# Patient Record
Sex: Female | Born: 1940 | Race: Black or African American | Hispanic: No | Smoking: Former smoker
Health system: Southern US, Community
[De-identification: ages and names within clinical notes are randomized; demographics above are authoritative.]

## PROBLEM LIST (undated history)

## (undated) DIAGNOSIS — R112 Nausea with vomiting, unspecified: Secondary | ICD-10-CM

## (undated) DIAGNOSIS — E785 Hyperlipidemia, unspecified: Secondary | ICD-10-CM

## (undated) DIAGNOSIS — I10 Essential (primary) hypertension: Secondary | ICD-10-CM

## (undated) DIAGNOSIS — Z9889 Other specified postprocedural states: Secondary | ICD-10-CM

## (undated) DIAGNOSIS — E039 Hypothyroidism, unspecified: Secondary | ICD-10-CM

## (undated) DIAGNOSIS — E119 Type 2 diabetes mellitus without complications: Secondary | ICD-10-CM

## (undated) DIAGNOSIS — M1990 Unspecified osteoarthritis, unspecified site: Secondary | ICD-10-CM

## (undated) DIAGNOSIS — M069 Rheumatoid arthritis, unspecified: Secondary | ICD-10-CM

## (undated) DIAGNOSIS — M2550 Pain in unspecified joint: Secondary | ICD-10-CM

## (undated) DIAGNOSIS — Z8709 Personal history of other diseases of the respiratory system: Secondary | ICD-10-CM

## (undated) HISTORY — PX: THYROIDECTOMY, PARTIAL: SHX18

## (undated) HISTORY — PX: APPENDECTOMY: SHX54

## (undated) HISTORY — PX: CATARACT EXTRACTION: SUR2

## (undated) HISTORY — PX: COLONOSCOPY: SHX174

---

## 1982-10-01 HISTORY — PX: ABDOMINAL HYSTERECTOMY: SHX81

## 2000-10-27 ENCOUNTER — Other Ambulatory Visit: Admission: RE | Admit: 2000-10-27 | Discharge: 2000-10-27 | Payer: Self-pay | Admitting: Internal Medicine

## 2001-07-09 ENCOUNTER — Encounter: Payer: Self-pay | Admitting: Emergency Medicine

## 2001-07-09 ENCOUNTER — Emergency Department (HOSPITAL_COMMUNITY): Admission: EM | Admit: 2001-07-09 | Discharge: 2001-07-09 | Payer: Self-pay | Admitting: Emergency Medicine

## 2001-10-16 ENCOUNTER — Other Ambulatory Visit: Admission: RE | Admit: 2001-10-16 | Discharge: 2001-10-16 | Payer: Self-pay | Admitting: Internal Medicine

## 2002-10-23 ENCOUNTER — Encounter: Admission: RE | Admit: 2002-10-23 | Discharge: 2002-10-23 | Payer: Self-pay | Admitting: Internal Medicine

## 2002-10-23 ENCOUNTER — Encounter: Payer: Self-pay | Admitting: Internal Medicine

## 2005-01-11 ENCOUNTER — Encounter: Admission: RE | Admit: 2005-01-11 | Discharge: 2005-01-11 | Payer: Self-pay | Admitting: Internal Medicine

## 2005-04-06 ENCOUNTER — Ambulatory Visit (HOSPITAL_COMMUNITY): Admission: RE | Admit: 2005-04-06 | Discharge: 2005-04-06 | Payer: Self-pay | Admitting: Gastroenterology

## 2005-04-06 ENCOUNTER — Encounter (INDEPENDENT_AMBULATORY_CARE_PROVIDER_SITE_OTHER): Payer: Self-pay | Admitting: *Deleted

## 2006-01-16 ENCOUNTER — Encounter: Admission: RE | Admit: 2006-01-16 | Discharge: 2006-01-16 | Payer: Self-pay | Admitting: Internal Medicine

## 2007-02-06 ENCOUNTER — Encounter: Admission: RE | Admit: 2007-02-06 | Discharge: 2007-02-06 | Payer: Self-pay | Admitting: Internal Medicine

## 2008-03-03 ENCOUNTER — Encounter: Admission: RE | Admit: 2008-03-03 | Discharge: 2008-03-03 | Payer: Self-pay | Admitting: Internal Medicine

## 2009-03-04 ENCOUNTER — Encounter: Admission: RE | Admit: 2009-03-04 | Discharge: 2009-03-04 | Payer: Self-pay | Admitting: Internal Medicine

## 2010-03-06 ENCOUNTER — Encounter: Admission: RE | Admit: 2010-03-06 | Discharge: 2010-03-06 | Payer: Self-pay | Admitting: Internal Medicine

## 2011-02-02 ENCOUNTER — Other Ambulatory Visit: Payer: Self-pay | Admitting: Internal Medicine

## 2011-02-02 DIAGNOSIS — Z1231 Encounter for screening mammogram for malignant neoplasm of breast: Secondary | ICD-10-CM

## 2011-02-16 NOTE — Op Note (Signed)
NAME:  Hannah Castillo, Hannah Castillo NO.:  [id number]   MEDICAL RECORD NO.:  [id number]          PATIENT TYPE:  AMB   LOCATION:  ENDO                         FACILITY:  [facility]   PHYSICIAN:  Anselmo Rod, M.D.  DATE OF BIRTH:  March 07, 1941   DATE OF PROCEDURE:  04/06/2005  DATE OF DISCHARGE:                                 OPERATIVE REPORT   PROCEDURE PERFORMED:  Colonoscopy with snare polypectomy times four and cold  biopsies times two.   ENDOSCOPIST:  Charna Elizabeth, M.D.   INSTRUMENT USED:  Olympus video colonoscope.   INDICATIONS FOR PROCEDURE:  The patient is a 70 year old African-American  female undergoing screening colonoscopy to rule out colonic polyps, masses,  etc.   PREPROCEDURE PREPARATION:  Informed consent was procured from the patient.  The patient was fasted for eight hours prior to the procedure and prepped  with a bottle of magnesium citrate and a gallon of GoLYTELY the night prior  to the procedure.  The risks and benefits of the procedure including a 10%  miss rate for colon polyps or cancers was discussed with the patient as  well.   PREPROCEDURE PHYSICAL:  The patient had stable vital signs.  Neck supple.  Chest clear to auscultation.  S1 and S2 regular.  Abdomen soft with normal  bowel sounds.   DESCRIPTION OF PROCEDURE:  The patient was placed in left lateral decubitus  position and sedated with 100 mg of Demerol and 10 mg of Versed in slow  incremental doses.  Once the patient was adequately sedated and maintained  on low flow oxygen and continuous cardiac monitoring, the Olympus video  colonoscope was advanced from the rectum to the cecum.  The appendicular  orifice and ileocecal valve were clearly visualized and photographed.  The  patient had pandiverticular disease with more prominent changes in the  sigmoid colon.  Two small sessile polyps were biopsied from the rectosigmoid  colon (cold biopsies) and two small sessile polyps were snared from  the  rectosigmoid colon.  Two additional small sessile polyps were snared from  the rectum.  All of these polyps were removed by cold snare.  Small internal  hemorrhoids were seen on retroflexion.  The rest of the exam was  unremarkable.  The appendicular orifice and ileocecal valve were visualized  and photographed.   IMPRESSION:  1.  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  2.  Small nonbleeding internal hemorrhoids.  3.  Four small sessile polyps snared (cold snare) from the rectosigmoid      colon and rectum and two small sessile polyps cold biopsied from the      rectosigmoid colon.   RECOMMENDATIONS:  1.  Await pathology results.  2.  Avoid all nonsteroidals including aspirin for now.  3.  Repeat colonoscopy depending on pathology results.  4.  Brochures on diverticulosis have been given to the patient for      education.  5.  Outpatient followup as need arises in the future.       JNM/MEDQ  D:  04/06/2005  T:  04/06/2005  Job:  [id number]  cc:   Theda Belfast. Baird Cancer, M.D.  [address]  [address] [place] [address]  Fax: [phone]

## 2011-03-13 ENCOUNTER — Ambulatory Visit
Admission: RE | Admit: 2011-03-13 | Discharge: 2011-03-13 | Disposition: A | Discharge status: Home / Self Care | Payer: Medicare Other | Source: Ambulatory Visit | Attending: Internal Medicine | Admitting: Internal Medicine

## 2011-03-13 DIAGNOSIS — Z1231 Encounter for screening mammogram for malignant neoplasm of breast: Secondary | ICD-10-CM

## 2011-03-14 ENCOUNTER — Other Ambulatory Visit: Payer: Self-pay | Admitting: Internal Medicine

## 2011-03-14 DIAGNOSIS — R928 Other abnormal and inconclusive findings on diagnostic imaging of breast: Secondary | ICD-10-CM

## 2011-03-21 ENCOUNTER — Ambulatory Visit
Admission: RE | Admit: 2011-03-21 | Discharge: 2011-03-21 | Disposition: A | Discharge status: Home / Self Care | Payer: Medicare Other | Source: Ambulatory Visit | Attending: Internal Medicine | Admitting: Internal Medicine

## 2011-03-21 DIAGNOSIS — R928 Other abnormal and inconclusive findings on diagnostic imaging of breast: Secondary | ICD-10-CM

## 2011-09-05 ENCOUNTER — Other Ambulatory Visit: Payer: Self-pay | Admitting: Internal Medicine

## 2011-09-05 DIAGNOSIS — N63 Unspecified lump in unspecified breast: Secondary | ICD-10-CM

## 2011-09-19 ENCOUNTER — Ambulatory Visit
Admission: RE | Admit: 2011-09-19 | Discharge: 2011-09-19 | Disposition: A | Discharge status: Home / Self Care | Payer: Medicare Other | Source: Ambulatory Visit | Attending: Internal Medicine | Admitting: Internal Medicine

## 2011-09-19 DIAGNOSIS — N63 Unspecified lump in unspecified breast: Secondary | ICD-10-CM

## 2011-10-04 ENCOUNTER — Encounter (HOSPITAL_COMMUNITY): Payer: Self-pay

## 2011-10-04 DIAGNOSIS — E7800 Pure hypercholesterolemia, unspecified: Secondary | ICD-10-CM | POA: Diagnosis not present

## 2011-10-04 DIAGNOSIS — M0840 Pauciarticular juvenile rheumatoid arthritis, unspecified site: Secondary | ICD-10-CM | POA: Diagnosis not present

## 2011-10-04 DIAGNOSIS — Z01810 Encounter for preprocedural cardiovascular examination: Secondary | ICD-10-CM | POA: Diagnosis not present

## 2011-10-04 DIAGNOSIS — I10 Essential (primary) hypertension: Secondary | ICD-10-CM | POA: Diagnosis not present

## 2011-10-05 DIAGNOSIS — E7800 Pure hypercholesterolemia, unspecified: Secondary | ICD-10-CM | POA: Diagnosis not present

## 2011-10-05 DIAGNOSIS — I10 Essential (primary) hypertension: Secondary | ICD-10-CM | POA: Diagnosis not present

## 2011-10-05 DIAGNOSIS — M0840 Pauciarticular juvenile rheumatoid arthritis, unspecified site: Secondary | ICD-10-CM | POA: Diagnosis not present

## 2011-10-05 DIAGNOSIS — Z01810 Encounter for preprocedural cardiovascular examination: Secondary | ICD-10-CM | POA: Diagnosis not present

## 2011-10-09 DIAGNOSIS — M1710 Unilateral primary osteoarthritis, unspecified knee: Secondary | ICD-10-CM | POA: Diagnosis not present

## 2011-10-12 ENCOUNTER — Encounter (HOSPITAL_COMMUNITY): Payer: Self-pay

## 2011-10-12 ENCOUNTER — Encounter (HOSPITAL_COMMUNITY)
Admission: RE | Admit: 2011-10-12 | Discharge: 2011-10-12 | Disposition: A | Discharge status: Home / Self Care | Payer: Medicare Other | Source: Ambulatory Visit | Attending: Surgery | Admitting: Surgery

## 2011-10-12 ENCOUNTER — Encounter (HOSPITAL_COMMUNITY)
Admission: RE | Admit: 2011-10-12 | Discharge: 2011-10-12 | Disposition: A | Discharge status: Home / Self Care | Payer: Medicare Other | Source: Ambulatory Visit | Attending: Orthopedic Surgery | Admitting: Orthopedic Surgery

## 2011-10-12 DIAGNOSIS — Z01811 Encounter for preprocedural respiratory examination: Secondary | ICD-10-CM | POA: Diagnosis not present

## 2011-10-12 HISTORY — DX: Essential (primary) hypertension: I10

## 2011-10-12 HISTORY — DX: Hypothyroidism, unspecified: E03.9

## 2011-10-12 HISTORY — DX: Other specified postprocedural states: R11.2

## 2011-10-12 HISTORY — DX: Rheumatoid arthritis, unspecified: M06.9

## 2011-10-12 HISTORY — DX: Unspecified osteoarthritis, unspecified site: M19.90

## 2011-10-12 HISTORY — DX: Other specified postprocedural states: Z98.890

## 2011-10-12 LAB — PROTIME-INR
INR: 1 (ref 0.00–1.49)
Prothrombin Time: 13.4 s (ref 11.6–15.2)

## 2011-10-12 LAB — URINALYSIS, ROUTINE W REFLEX MICROSCOPIC
Bilirubin Urine: NEGATIVE
Glucose, UA: NEGATIVE mg/dL
Hgb urine dipstick: NEGATIVE
Specific Gravity, Urine: 1.019 (ref 1.005–1.030)
Urobilinogen, UA: 0.2 mg/dL (ref 0.0–1.0)

## 2011-10-12 LAB — CBC
HCT: 40.9 % (ref 36.0–46.0)
Hemoglobin: 13.6 g/dL (ref 12.0–15.0)
MCH: 30 pg (ref 26.0–34.0)
MCHC: 33.3 g/dL (ref 30.0–36.0)
RBC: 4.54 MIL/uL (ref 3.87–5.11)

## 2011-10-12 LAB — COMPREHENSIVE METABOLIC PANEL WITH GFR
ALT: 38 U/L — ABNORMAL HIGH (ref 0–35)
AST: 27 U/L (ref 0–37)
Albumin: 3.8 g/dL (ref 3.5–5.2)
Alkaline Phosphatase: 173 U/L — ABNORMAL HIGH (ref 39–117)
BUN: 16 mg/dL (ref 6–23)
CO2: 30 meq/L (ref 19–32)
Calcium: 10.8 mg/dL — ABNORMAL HIGH (ref 8.4–10.5)
Chloride: 104 meq/L (ref 96–112)
Creatinine, Ser: 0.87 mg/dL (ref 0.50–1.10)
GFR calc Af Amer: 75 mL/min — ABNORMAL LOW (ref >90)
GFR calc non Af Amer: 65 mL/min — ABNORMAL LOW (ref >90)
Glucose, Bld: 82 mg/dL (ref 70–99)
Potassium: 4.3 meq/L (ref 3.5–5.1)
Sodium: 141 meq/L (ref 135–145)
Total Bilirubin: 0.8 mg/dL (ref 0.3–1.2)
Total Protein: 7 g/dL (ref 6.0–8.3)

## 2011-10-12 LAB — ABO/RH: ABO/RH(D): O POS

## 2011-10-12 LAB — SURGICAL PCR SCREEN
MRSA, PCR: NEGATIVE
Staphylococcus aureus: NEGATIVE

## 2011-10-12 LAB — TYPE AND SCREEN: Antibody Screen: NEGATIVE

## 2011-10-12 NOTE — Pre-Procedure Instructions (Signed)
20 XARA PAULDING  10/12/2011   Your procedure is scheduled on:  October 17, 2011  Report to [facility] at 0630 AM.  Call this number if you have problems the morning of surgery: [phone]   Remember:   Do not eat food:After Midnight.  May have clear liquids: up to 4 Hours before arrival.  Clear liquids include soda, tea, black coffee, apple or grape juice, broth.  Take these medicines the morning of surgery with A SIP OF WATER: Synthroid   Stop Aspirin today  Do not wear jewelry, make-up or nail polish.  Do not wear lotions, powders, or perfumes. You may wear deodorant.  Do not shave 48 hours prior to surgery.  Do not bring valuables to the hospital.  Contacts, dentures or bridgework may not be worn into surgery.  Leave suitcase in the car. After surgery it may be brought to your room.  For patients admitted to the hospital, checkout time is 11:00 AM the day of discharge.     Special Instructions: Incentive Spirometry - Practice and bring it with you on the day of surgery. and CHG Shower Use Special Wash: 1/2 bottle night before surgery and 1/2 bottle morning of surgery.   Please read over the following fact sheets that you were given: Pain Booklet, Coughing and Deep Breathing, Blood Transfusion Information, Total Joint Packet, MRSA Information and Surgical Site Infection Prevention

## 2011-10-15 NOTE — H&P (Signed)
  [facility] [address]   [address] [place], [place] [address] [phone] A Division of [facility]  Loreta Ave, M.D.     Robert A. Thurston Hole, M.D.     Lunette Stands, M.D. Eulas Post, M.D.    Buford Dresser, M.D. Estell Harpin, M.D. Ralene Cork, D.O.          Genene Churn. Barry Dienes, PA-C            Kirstin A. Shepperson, PA-C [place], OPA-C   RE: Hannah, Castillo                                [id number]      DOB: 09-27-1941 PROGRESS NOTE: 10-09-11 Chief complaint: Right knee pain.  History of present illness: 32 two year-old female with a history of end stage DJD, right knee, and chronic pain.  Returns.  States that knee symptoms are unchanged from previous visit.  She is wanting to proceed with total knee replacement as scheduled.  We are awaiting pre-op clearance from Dr. Nadara Eaton.  She recently had a stress test.  Denies chest pain or shortness of breath.   Current medications: Aspirin, Synthroid, HCTZ, Lipitor and Plaquenil. Allergies: Hydrocodone/Ibuprofen (vomiting).  Past medical/surgical history: Hypertension, hypercholesterolemia, rheumatoid arthritis, hypothyroidism, thyroidectomy, appendectomy and hysterectomy. Review of systems: Patient denies fevers, chills, lightheadedness, dizziness, GI or GU issues.    Family history: Positive for heart disease, hypertension and stroke. Social history: Quit smoking in 2003 and has a 40 year history previously.  Denies alcohol use.  Patient lives alone, has a daughter here in town.       EXAMINATION: Temperature: 97.8.  Pulse: 77.  Blood pressure: 129/80.  Height: 5?5.  Weight: 185 pounds.  Pleasant female, alert and oriented x 3 and in no acute distress.  Head is normocephalic, a traumatic.  PERRLA, EOMI.  Neck unremarkable.  Lungs: CTA bilaterally.  No wheezes.  Heart: RRR.  S1 and S2.  Abdomen: Round and non-distended.  NBS x 4.  Soft and non-tender.   Right knee: Decreased range of motion.  Positive crepitus.  Joint line tender.  1-2+ effusion.  Ligaments stable.  Calf non-tender.  Neurovascularly intact.  Skin warm and dry.    IMPRESSION: End stage DJD, right knee, and chronic pain.  Failed conservative treatment.   PLAN:  We will proceed with right total knee replacement as scheduled.  We will need to get final clearance from Dr. Nadara Eaton.  We will hold Plaquenil x one week post-op, per Dr. Fatima Sanger recommendation.  All questions answered.    Loreta Ave, M.D.   Electronically verified by Loreta Ave, M.D. DFM(JMO):jjh D 10-09-11 T 10-10-11

## 2011-10-16 MED ORDER — CEFAZOLIN SODIUM-DEXTROSE 2-3 GM-% IV SOLR
2.0000 g | INTRAVENOUS | Status: AC
  Administered 2011-10-17: 2 g via INTRAVENOUS
  Filled 2011-10-16 (×2): qty 50

## 2011-10-17 ENCOUNTER — Encounter (HOSPITAL_COMMUNITY)
Admission: RE | Disposition: A | Discharge status: Skilled Nursing Facility | Payer: Self-pay | Source: Ambulatory Visit | Attending: Orthopedic Surgery

## 2011-10-17 ENCOUNTER — Encounter (HOSPITAL_COMMUNITY): Payer: Self-pay | Admitting: *Deleted

## 2011-10-17 ENCOUNTER — Ambulatory Visit (HOSPITAL_COMMUNITY): Payer: Medicare Other

## 2011-10-17 ENCOUNTER — Encounter (HOSPITAL_COMMUNITY): Payer: Self-pay | Admitting: Anesthesiology

## 2011-10-17 ENCOUNTER — Ambulatory Visit (HOSPITAL_COMMUNITY): Payer: Medicare Other | Admitting: Anesthesiology

## 2011-10-17 ENCOUNTER — Inpatient Hospital Stay (HOSPITAL_COMMUNITY)
Admission: RE | Admit: 2011-10-17 | Discharge: 2011-10-21 | DRG: 470 | Disposition: A | Discharge status: Skilled Nursing Facility | Payer: Medicare Other | Source: Ambulatory Visit | Attending: Orthopedic Surgery | Admitting: Orthopedic Surgery

## 2011-10-17 DIAGNOSIS — M1710 Unilateral primary osteoarthritis, unspecified knee: Principal | ICD-10-CM | POA: Diagnosis present

## 2011-10-17 DIAGNOSIS — Z01812 Encounter for preprocedural laboratory examination: Secondary | ICD-10-CM

## 2011-10-17 DIAGNOSIS — E039 Hypothyroidism, unspecified: Secondary | ICD-10-CM | POA: Diagnosis present

## 2011-10-17 DIAGNOSIS — K5900 Constipation, unspecified: Secondary | ICD-10-CM | POA: Diagnosis not present

## 2011-10-17 DIAGNOSIS — G8918 Other acute postprocedural pain: Secondary | ICD-10-CM | POA: Diagnosis not present

## 2011-10-17 DIAGNOSIS — M069 Rheumatoid arthritis, unspecified: Secondary | ICD-10-CM | POA: Diagnosis present

## 2011-10-17 DIAGNOSIS — Z87891 Personal history of nicotine dependence: Secondary | ICD-10-CM

## 2011-10-17 DIAGNOSIS — Z96659 Presence of unspecified artificial knee joint: Secondary | ICD-10-CM | POA: Diagnosis not present

## 2011-10-17 DIAGNOSIS — I10 Essential (primary) hypertension: Secondary | ICD-10-CM | POA: Diagnosis not present

## 2011-10-17 DIAGNOSIS — E7800 Pure hypercholesterolemia, unspecified: Secondary | ICD-10-CM | POA: Diagnosis present

## 2011-10-17 DIAGNOSIS — Z471 Aftercare following joint replacement surgery: Secondary | ICD-10-CM | POA: Diagnosis not present

## 2011-10-17 DIAGNOSIS — E785 Hyperlipidemia, unspecified: Secondary | ICD-10-CM | POA: Diagnosis not present

## 2011-10-17 DIAGNOSIS — M25569 Pain in unspecified knee: Secondary | ICD-10-CM | POA: Diagnosis not present

## 2011-10-17 DIAGNOSIS — S99929A Unspecified injury of unspecified foot, initial encounter: Secondary | ICD-10-CM | POA: Diagnosis not present

## 2011-10-17 DIAGNOSIS — Z5189 Encounter for other specified aftercare: Secondary | ICD-10-CM | POA: Diagnosis not present

## 2011-10-17 HISTORY — PX: TOTAL KNEE ARTHROPLASTY: SHX125

## 2011-10-17 SURGERY — ARTHROPLASTY, KNEE, TOTAL
Anesthesia: General | Site: Knee | Laterality: Right

## 2011-10-17 MED ORDER — FENTANYL CITRATE 0.05 MG/ML IJ SOLN
INTRAMUSCULAR | Status: DC | PRN
  Administered 2011-10-17 (×3): 50 ug via INTRAVENOUS
  Administered 2011-10-17: 100 ug via INTRAVENOUS

## 2011-10-17 MED ORDER — WARFARIN SODIUM 7.5 MG PO TABS
7.5000 mg | ORAL_TABLET | Freq: Once | ORAL | Status: AC
  Administered 2011-10-17: 7.5 mg via ORAL
  Filled 2011-10-17: qty 1

## 2011-10-17 MED ORDER — MENTHOL 3 MG MT LOZG
1.0000 | LOZENGE | OROMUCOSAL | Status: DC | PRN
  Filled 2011-10-17: qty 9

## 2011-10-17 MED ORDER — LIDOCAINE HCL (CARDIAC) 20 MG/ML IV SOLN
INTRAVENOUS | Status: DC | PRN
  Administered 2011-10-17: 40 mg via INTRAVENOUS

## 2011-10-17 MED ORDER — BUPIVACAINE HCL (PF) 0.5 % IJ SOLN
INTRAMUSCULAR | Status: DC | PRN
  Administered 2011-10-17: 20 mL

## 2011-10-17 MED ORDER — ACETAMINOPHEN 10 MG/ML IV SOLN
INTRAVENOUS | Status: AC
  Filled 2011-10-17: qty 100

## 2011-10-17 MED ORDER — NEOSTIGMINE METHYLSULFATE 1 MG/ML IJ SOLN
INTRAMUSCULAR | Status: DC | PRN
  Administered 2011-10-17: 4 mg via INTRAVENOUS

## 2011-10-17 MED ORDER — ACETAMINOPHEN 650 MG RE SUPP: 650.0000 mg | Freq: Four times a day (QID) | RECTAL | Status: DC | PRN

## 2011-10-17 MED ORDER — HETASTARCH-ELECTROLYTES 6 % IV SOLN
INTRAVENOUS | Status: DC | PRN
  Administered 2011-10-17: 09:00:00 via INTRAVENOUS

## 2011-10-17 MED ORDER — COUMADIN BOOK
Freq: Once | Status: AC
  Administered 2011-10-17: 18:00:00
  Filled 2011-10-17: qty 1

## 2011-10-17 MED ORDER — ROSUVASTATIN CALCIUM 20 MG PO TABS
20.0000 mg | ORAL_TABLET | Freq: Every day | ORAL | Status: DC
  Administered 2011-10-17 – 2011-10-21 (×5): 20 mg via ORAL
  Filled 2011-10-17 (×5): qty 1

## 2011-10-17 MED ORDER — GLYCOPYRROLATE 0.2 MG/ML IJ SOLN
INTRAMUSCULAR | Status: DC | PRN
  Administered 2011-10-17: .6 mg via INTRAVENOUS

## 2011-10-17 MED ORDER — BUPIVACAINE HCL (PF) 0.25 % IJ SOLN
INTRAMUSCULAR | Status: DC | PRN
  Administered 2011-10-17: 30 mL via INTRA_ARTICULAR

## 2011-10-17 MED ORDER — ACETAMINOPHEN 325 MG PO TABS
650.0000 mg | ORAL_TABLET | Freq: Four times a day (QID) | ORAL | Status: DC | PRN
  Administered 2011-10-18: 650 mg via ORAL
  Filled 2011-10-17: qty 2

## 2011-10-17 MED ORDER — LACTATED RINGERS IV SOLN
INTRAVENOUS | Status: DC | PRN
  Administered 2011-10-17: 08:00:00 via INTRAVENOUS

## 2011-10-17 MED ORDER — METHOCARBAMOL 500 MG PO TABS
500.0000 mg | ORAL_TABLET | Freq: Four times a day (QID) | ORAL | Status: DC | PRN
  Administered 2011-10-18 – 2011-10-19 (×4): 500 mg via ORAL
  Filled 2011-10-17 (×5): qty 1

## 2011-10-17 MED ORDER — ROCURONIUM BROMIDE 100 MG/10ML IV SOLN
INTRAVENOUS | Status: DC | PRN
  Administered 2011-10-17: 35 mg via INTRAVENOUS

## 2011-10-17 MED ORDER — ONDANSETRON HCL 4 MG/2ML IJ SOLN: 4.0000 mg | Freq: Once | INTRAMUSCULAR | Status: DC | PRN

## 2011-10-17 MED ORDER — PROPOFOL 10 MG/ML IV EMUL
INTRAVENOUS | Status: DC | PRN
  Administered 2011-10-17: 170 mg via INTRAVENOUS

## 2011-10-17 MED ORDER — DOCUSATE SODIUM 100 MG PO CAPS
100.0000 mg | ORAL_CAPSULE | Freq: Two times a day (BID) | ORAL | Status: DC
  Administered 2011-10-17 – 2011-10-21 (×9): 100 mg via ORAL
  Filled 2011-10-17 (×10): qty 1

## 2011-10-17 MED ORDER — MORPHINE SULFATE (PF) 1 MG/ML IV SOLN
INTRAVENOUS | Status: AC
  Administered 2011-10-17: 25 mL via INTRAVENOUS
  Filled 2011-10-17: qty 25

## 2011-10-17 MED ORDER — PHENOL 1.4 % MT LIQD
1.0000 | OROMUCOSAL | Status: DC | PRN
  Filled 2011-10-17: qty 177

## 2011-10-17 MED ORDER — DIPHENHYDRAMINE HCL 12.5 MG/5ML PO ELIX
12.5000 mg | ORAL_SOLUTION | Freq: Four times a day (QID) | ORAL | Status: DC | PRN
  Filled 2011-10-17: qty 5

## 2011-10-17 MED ORDER — ENOXAPARIN SODIUM 30 MG/0.3ML ~~LOC~~ SOLN
30.0000 mg | Freq: Two times a day (BID) | SUBCUTANEOUS | Status: DC
  Administered 2011-10-17 – 2011-10-19 (×4): 30 mg via SUBCUTANEOUS
  Filled 2011-10-17 (×6): qty 0.3

## 2011-10-17 MED ORDER — HYDROMORPHONE HCL PF 1 MG/ML IJ SOLN
0.2500 mg | INTRAMUSCULAR | Status: DC | PRN
  Administered 2011-10-17: 0.5 mg via INTRAVENOUS

## 2011-10-17 MED ORDER — DEXAMETHASONE SODIUM PHOSPHATE 4 MG/ML IJ SOLN
INTRAMUSCULAR | Status: DC | PRN
  Administered 2011-10-17: 4 mg via INTRAVENOUS

## 2011-10-17 MED ORDER — DIPHENHYDRAMINE HCL 50 MG/ML IJ SOLN
12.5000 mg | Freq: Four times a day (QID) | INTRAMUSCULAR | Status: DC | PRN
  Filled 2011-10-17: qty 0.25

## 2011-10-17 MED ORDER — SENNOSIDES-DOCUSATE SODIUM 8.6-50 MG PO TABS: 1.0000 | ORAL_TABLET | Freq: Every evening | ORAL | Status: DC | PRN

## 2011-10-17 MED ORDER — LEVOTHYROXINE SODIUM 150 MCG PO TABS
150.0000 ug | ORAL_TABLET | Freq: Every day | ORAL | Status: DC
  Administered 2011-10-18 – 2011-10-21 (×4): 150 ug via ORAL
  Filled 2011-10-17 (×5): qty 1

## 2011-10-17 MED ORDER — HYDROCHLOROTHIAZIDE 12.5 MG PO CAPS
12.5000 mg | ORAL_CAPSULE | Freq: Every day | ORAL | Status: DC
  Filled 2011-10-17 (×5): qty 1

## 2011-10-17 MED ORDER — CEFAZOLIN SODIUM 1-5 GM-% IV SOLN
1.0000 g | Freq: Three times a day (TID) | INTRAVENOUS | Status: AC
  Administered 2011-10-17 – 2011-10-18 (×3): 1 g via INTRAVENOUS
  Filled 2011-10-17 (×3): qty 50

## 2011-10-17 MED ORDER — ONDANSETRON HCL 4 MG/2ML IJ SOLN
INTRAMUSCULAR | Status: DC | PRN
  Administered 2011-10-17: 4 mg via INTRAVENOUS

## 2011-10-17 MED ORDER — SODIUM CHLORIDE 0.9 % IJ SOLN: 9.0000 mL | INTRAMUSCULAR | Status: DC | PRN

## 2011-10-17 MED ORDER — FLEET ENEMA 7-19 GM/118ML RE ENEM: 1.0000 | ENEMA | Freq: Once | RECTAL | Status: AC | PRN

## 2011-10-17 MED ORDER — ONDANSETRON HCL 4 MG PO TABS: 4.0000 mg | ORAL_TABLET | Freq: Four times a day (QID) | ORAL | Status: DC | PRN

## 2011-10-17 MED ORDER — OXYCODONE-ACETAMINOPHEN 5-325 MG PO TABS
1.0000 | ORAL_TABLET | ORAL | Status: DC | PRN
  Administered 2011-10-18 – 2011-10-21 (×9): 2 via ORAL
  Filled 2011-10-17 (×10): qty 2

## 2011-10-17 MED ORDER — LISINOPRIL-HYDROCHLOROTHIAZIDE 10-12.5 MG PO TABS: 1.0000 | ORAL_TABLET | Freq: Every day | ORAL | Status: DC

## 2011-10-17 MED ORDER — INFLUENZA VIRUS VACC SPLIT PF IM SUSP
0.5000 mL | INTRAMUSCULAR | Status: AC
  Filled 2011-10-17: qty 0.5

## 2011-10-17 MED ORDER — ONDANSETRON HCL 4 MG/2ML IJ SOLN
4.0000 mg | Freq: Four times a day (QID) | INTRAMUSCULAR | Status: DC | PRN
  Administered 2011-10-17: 4 mg via INTRAVENOUS
  Filled 2011-10-17: qty 2

## 2011-10-17 MED ORDER — ONDANSETRON HCL 4 MG/2ML IJ SOLN
4.0000 mg | Freq: Four times a day (QID) | INTRAMUSCULAR | Status: DC | PRN
  Filled 2011-10-17: qty 2

## 2011-10-17 MED ORDER — SODIUM CHLORIDE 0.9 % IR SOLN
Status: DC | PRN
  Administered 2011-10-17: 1000 mL
  Administered 2011-10-17: 3000 mL

## 2011-10-17 MED ORDER — PHENYLEPHRINE HCL 10 MG/ML IJ SOLN
INTRAMUSCULAR | Status: DC | PRN
  Administered 2011-10-17 (×3): 80 ug via INTRAVENOUS
  Administered 2011-10-17: 40 ug via INTRAVENOUS
  Administered 2011-10-17: 80 ug via INTRAVENOUS

## 2011-10-17 MED ORDER — POTASSIUM CHLORIDE IN NACL 20-0.9 MEQ/L-% IV SOLN
INTRAVENOUS | Status: DC
  Administered 2011-10-17: 14:00:00 via INTRAVENOUS
  Filled 2011-10-17 (×11): qty 1000

## 2011-10-17 MED ORDER — LISINOPRIL 10 MG PO TABS
10.0000 mg | ORAL_TABLET | Freq: Every day | ORAL | Status: DC
  Filled 2011-10-17 (×5): qty 1

## 2011-10-17 MED ORDER — METHOCARBAMOL 100 MG/ML IJ SOLN
500.0000 mg | Freq: Four times a day (QID) | INTRAVENOUS | Status: DC | PRN
  Filled 2011-10-17: qty 5

## 2011-10-17 MED ORDER — METHOCARBAMOL 100 MG/ML IJ SOLN
500.0000 mg | INTRAMUSCULAR | Status: AC
  Administered 2011-10-17: 500 mg via INTRAVENOUS
  Filled 2011-10-17: qty 5

## 2011-10-17 MED ORDER — MORPHINE SULFATE 4 MG/ML IJ SOLN
INTRAMUSCULAR | Status: DC | PRN
  Administered 2011-10-17: 4 mg via SUBCUTANEOUS

## 2011-10-17 MED ORDER — ACETAMINOPHEN 10 MG/ML IV SOLN
INTRAVENOUS | Status: DC | PRN
  Administered 2011-10-17: 1000 mg via INTRAVENOUS

## 2011-10-17 MED ORDER — NALOXONE HCL 0.4 MG/ML IJ SOLN
0.4000 mg | INTRAMUSCULAR | Status: DC | PRN
  Filled 2011-10-17: qty 1

## 2011-10-17 MED ORDER — WARFARIN VIDEO: Freq: Once | Status: DC

## 2011-10-17 MED ORDER — MORPHINE SULFATE (PF) 1 MG/ML IV SOLN
INTRAVENOUS | Status: DC
  Administered 2011-10-17 (×2): 1 mg via INTRAVENOUS
  Administered 2011-10-18: 7 mg via INTRAVENOUS
  Administered 2011-10-18: 1 mg via INTRAVENOUS
  Administered 2011-10-18: 6 mg via INTRAVENOUS
  Filled 2011-10-17: qty 25

## 2011-10-17 SURGICAL SUPPLY — 64 items
BANDAGE ESMARK 6X9 LF (GAUZE/BANDAGES/DRESSINGS) ×1 IMPLANT
BLADE SAG 18X100X1.27 (BLADE) ×4 IMPLANT
BNDG CMPR 9X6 STRL LF SNTH (GAUZE/BANDAGES/DRESSINGS) ×1
BNDG ESMARK 6X9 LF (GAUZE/BANDAGES/DRESSINGS) ×2
BOOTCOVER CLEANROOM LRG (PROTECTIVE WEAR) ×4 IMPLANT
BOWL SMART MIX CTS (DISPOSABLE) ×2 IMPLANT
CEMENT BONE SIMPLEX SPEEDSET (Cement) ×4 IMPLANT
CLOTH BEACON ORANGE TIMEOUT ST (SAFETY) ×2 IMPLANT
COVER BACK TABLE 24X17X13 BIG (DRAPES) ×1 IMPLANT
COVER SURGICAL LIGHT HANDLE (MISCELLANEOUS) ×3 IMPLANT
CUFF TOURNIQUET SINGLE 34IN LL (TOURNIQUET CUFF) ×2 IMPLANT
DRAPE EXTREMITY T 121X128X90 (DRAPE) ×2 IMPLANT
DRAPE PROXIMA HALF (DRAPES) ×2 IMPLANT
DRAPE U-SHAPE 47X51 STRL (DRAPES) ×2 IMPLANT
DRSG PAD ABDOMINAL 8X10 ST (GAUZE/BANDAGES/DRESSINGS) ×2 IMPLANT
DURAPREP 26ML APPLICATOR (WOUND CARE) ×2 IMPLANT
ELECT CAUTERY BLADE 6.4 (BLADE) ×2 IMPLANT
ELECT REM PT RETURN 9FT ADLT (ELECTROSURGICAL) ×2
ELECTRODE REM PT RTRN 9FT ADLT (ELECTROSURGICAL) ×1 IMPLANT
EVACUATOR 1/8 PVC DRAIN (DRAIN) ×2 IMPLANT
FACESHIELD LNG OPTICON STERILE (SAFETY) ×2 IMPLANT
FAN SPRAY TIP ×1 IMPLANT
GAUZE XEROFORM 5X9 LF (GAUZE/BANDAGES/DRESSINGS) ×2 IMPLANT
GLOVE BIOGEL PI IND STRL 7.0 (GLOVE) IMPLANT
GLOVE BIOGEL PI IND STRL 8 (GLOVE) ×1 IMPLANT
GLOVE BIOGEL PI INDICATOR 7.0 (GLOVE) ×1
GLOVE BIOGEL PI INDICATOR 8 (GLOVE) ×1
GLOVE ORTHO TXT STRL SZ7.5 (GLOVE) ×2 IMPLANT
GLOVE SURG SS PI 6.5 STRL IVOR (GLOVE) ×1 IMPLANT
GLOVE SURG SS PI 7.5 STRL IVOR (GLOVE) ×1 IMPLANT
GOWN PREVENTION PLUS XLARGE (GOWN DISPOSABLE) ×3 IMPLANT
GOWN PREVENTION PLUS XXLARGE (GOWN DISPOSABLE) ×2 IMPLANT
GOWN STRL NON-REIN LRG LVL3 (GOWN DISPOSABLE) ×3 IMPLANT
GOWN STRL REIN XL XLG (GOWN DISPOSABLE) ×1 IMPLANT
HANDPIECE INTERPULSE COAX TIP (DISPOSABLE) ×2
IMMOBILIZER KNEE 22 UNIV (SOFTGOODS) ×2 IMPLANT
IMMOBILIZER KNEE 24 THIGH 36 (MISCELLANEOUS) IMPLANT
IMMOBILIZER KNEE 24 UNIV (MISCELLANEOUS)
KIT BASIN OR (CUSTOM PROCEDURE TRAY) ×2 IMPLANT
KIT ROOM TURNOVER OR (KITS) ×2 IMPLANT
MANIFOLD NEPTUNE II (INSTRUMENTS) ×2 IMPLANT
NS IRRIG 1000ML POUR BTL (IV SOLUTION) ×2 IMPLANT
PACK TOTAL JOINT (CUSTOM PROCEDURE TRAY) ×2 IMPLANT
PAD ARMBOARD 7.5X6 YLW CONV (MISCELLANEOUS) ×4 IMPLANT
PAD CAST 4YDX4 CTTN HI CHSV (CAST SUPPLIES) ×1 IMPLANT
PADDING CAST COTTON 4X4 STRL (CAST SUPPLIES) ×2
PADDING CAST COTTON 6X4 STRL (CAST SUPPLIES) ×2 IMPLANT
PADDING WEBRIL 4 STERILE (GAUZE/BANDAGES/DRESSINGS) ×1 IMPLANT
PADDING WEBRIL 6 STERILE (GAUZE/BANDAGES/DRESSINGS) ×1 IMPLANT
RUBBERBAND STERILE (MISCELLANEOUS) ×2 IMPLANT
SET HNDPC FAN SPRY TIP SCT (DISPOSABLE) ×1 IMPLANT
SPONGE GAUZE 4X4 12PLY (GAUZE/BANDAGES/DRESSINGS) ×2 IMPLANT
STAPLER VISISTAT 35W (STAPLE) ×2 IMPLANT
SUCTION FRAZIER TIP 10 FR DISP (SUCTIONS) ×2 IMPLANT
SUT VIC AB 1 CTX 36 (SUTURE) ×4
SUT VIC AB 1 CTX36XBRD ANBCTR (SUTURE) ×2 IMPLANT
SUT VIC AB 2-0 CT1 27 (SUTURE) ×4
SUT VIC AB 2-0 CT1 TAPERPNT 27 (SUTURE) ×2 IMPLANT
SYR 30ML LL (SYRINGE) ×2 IMPLANT
SYR 30ML SLIP (SYRINGE) ×2 IMPLANT
TOWEL OR 17X24 6PK STRL BLUE (TOWEL DISPOSABLE) ×2 IMPLANT
TOWEL OR 17X26 10 PK STRL BLUE (TOWEL DISPOSABLE) ×2 IMPLANT
TRAY FOLEY CATH 14FR (SET/KITS/TRAYS/PACK) ×2 IMPLANT
WATER STERILE IRR 1000ML POUR (IV SOLUTION) ×5 IMPLANT

## 2011-10-17 NOTE — Anesthesia Preprocedure Evaluation (Addendum)
Anesthesia Evaluation  Patient identified by MRN, date of birth, ID band Patient awake    Reviewed: Allergy & Precautions, H&P , NPO status , Patient's Chart, lab work & pertinent test results, reviewed documented beta blocker date and time   History of Anesthesia Complications (+) AWARENESS UNDER ANESTHESIA  Airway Mallampati: II  Neck ROM: Full    Dental  (+) Edentulous Upper and Edentulous Lower   Pulmonary neg pulmonary ROS,    Pulmonary exam normal       Cardiovascular hypertension, Pt. on medications regular Normal    Neuro/Psych Negative Neurological ROS     GI/Hepatic negative GI ROS, Neg liver ROS,   Endo/Other  Negative Endocrine ROSHypothyroidism   Renal/GU negative Renal ROS  Genitourinary negative   Musculoskeletal  (+) Arthritis -,   Abdominal   Peds  Hematology negative hematology ROS (+)   Anesthesia Other Findings   Reproductive/Obstetrics                         Anesthesia Physical Anesthesia Plan  ASA: II  Anesthesia Plan: General   Post-op Pain Management:    Induction: Intravenous  Airway Management Planned: Oral ETT  Additional Equipment:   Intra-op Plan:   Post-operative Plan: Extubation in OR  Informed Consent: I have reviewed the patients History and Physical, chart, labs and discussed the procedure including the risks, benefits and alternatives for the proposed anesthesia with the patient or authorized representative who has indicated his/her understanding and acceptance.   Dental advisory given  Plan Discussed with: CRNA, Anesthesiologist and Surgeon  Anesthesia Plan Comments:        Anesthesia Quick Evaluation

## 2011-10-17 NOTE — Plan of Care (Signed)
Problem: Consults Goal: Diagnosis- Total Joint Replacement Outcome: Completed/Met Date Met:  10/17/11 Primary Total Knee

## 2011-10-17 NOTE — Brief Op Note (Signed)
10/17/2011  1:47 PM  PATIENT:  Hannah Castillo  71 y.o. female  PRE-OPERATIVE DIAGNOSIS:  degenerative joint disease RIGHT KNEE  POST-OPERATIVE DIAGNOSIS:  degenerative joint disease RIGHT KNEE  PROCEDURE:  Procedure(s): Right TOTAL KNEE ARTHROPLASTY  SURGEON:  Surgeon(s): Loreta Ave, MD  PHYSICIAN ASSISTANT: Zonia Kief M ANESTHESIA:   regional and general  EBL:  Total I/O In: 1300 [I.V.:800; IV Piggyback:500] Out: 250 [Urine:150; Blood:100]   SPECIMEN:  No Specimen  DISPOSITION OF SPECIMEN:  N/A  COUNTS:  YES  TOURNIQUET:   Total Tourniquet Time Documented: Thigh (Right) - 73 minutes     PATIENT DISPOSITION:  PACU - hemodynamically stable.

## 2011-10-17 NOTE — Progress Notes (Signed)
ANTICOAGULATION CONSULT NOTE - Initial Consult  Pharmacy Consult for Coumadin Indication: VTE prophylaxis  Allergies  Allergen Reactions  . Hydrocodone Nausea Only    MAKES PT FEEL VERY "SICK"    Patient Measurements: Height: 5\' 5"  (165.1 cm) (from 1/11 pre-op visit) Weight: 184 lb 8 oz (83.689 kg) IBW/kg (Calculated) : 57    Vital Signs: Temp: 97.6 F (36.4 C) (01/16 1230) Temp src: Oral (01/16 1230) BP: 99/69 mmHg (01/16 1230) Pulse Rate: 63  (01/16 1230)  Labs: preop labs:  PT 13.4, INR 1.0, PTT 38,  H/H 13.6/40.9, PLTC 286, WBC 5.0 No results found for this basename: HGB:2,HCT:3,PLT:3,APTT:3,LABPROT:3,INR:3,HEPARINUNFRC:3,CREATININE:3,CKTOTAL:3,CKMB:3,TROPONINI:3 in the last 72 hours Estimated Creatinine Clearance: 62.5 ml/min (by C-G formula based on Cr of 0.87).  Medical History: Past Medical History  Diagnosis Date  . Hypothyroidism   . Rheumatoid arthritis   . Hypertension     Seen Dr. Nadara Eaton for clearnence  . PONV (postoperative nausea and vomiting)   . Degenerative joint disease     Medications:  Prescriptions prior to admission  Medication Sig Dispense Refill  . Acetaminophen (TYLENOL ARTHRITIS PAIN PO) Take 2 tablets by mouth as needed.      Marland Kitchen aspirin EC 81 MG tablet Take 81 mg by mouth daily.        Marland Kitchen atorvastatin (LIPITOR) 40 MG tablet Take 40 mg by mouth daily.        . hydroxychloroquine (PLAQUENIL) 200 MG tablet Take 200 mg by mouth 2 (two) times daily.        Marland Kitchen levothyroxine (SYNTHROID, LEVOTHROID) 150 MCG tablet Take 150 mcg by mouth daily.        Marland Kitchen lisinopril-hydrochlorothiazide (PRINZIDE,ZESTORETIC) 10-12.5 MG per tablet Take 1 tablet by mouth daily.         Scheduled:    . ceFAZolin (ANCEF) IV  1 g Intravenous Q8H  . ceFAZolin (ANCEF) IV  2 g Intravenous 60 min Pre-Op  . docusate sodium  100 mg Oral BID  . enoxaparin  30 mg Subcutaneous Q12H  . lisinopril  10 mg Oral Daily   And  . hydrochlorothiazide  12.5 mg Oral Daily  . levothyroxine   150 mcg Oral Daily  . methocarbamol(ROBAXIN) IV  500 mg Intravenous To PACU  . morphine   Intravenous Q4H  . morphine   Intravenous To PACU  . rosuvastatin  20 mg Oral Daily  . DISCONTD: lisinopril-hydrochlorothiazide  1 tablet Oral Daily    Assessment: 71 yo female s/p right TKA due to DJD. No h/o bleeding complications. Lovenox bridge/coumadin for VTE prophylaxis  Goal of Therapy:  INR 2-3   Plan:  Coumadin 7.5 mg po today x1.  Monitor INR qAM. Coumadin education prior to discharge.  Follow up for dc of lovenox when INR therapeutic.  Arman Filter 10/17/2011,2:46 PM

## 2011-10-17 NOTE — Anesthesia Procedure Notes (Addendum)
Procedure Name: Intubation Date/Time: 10/17/2011 8:42 AM Performed by: Elizbeth Squires Pre-anesthesia Checklist: Patient identified, Emergency Drugs available, Suction available and Patient being monitored Patient Re-evaluated:Patient Re-evaluated prior to inductionOxygen Delivery Method: Circle System Utilized Preoxygenation: Pre-oxygenation with 100% oxygen Intubation Type: IV induction Ventilation: Mask ventilation without difficulty and Oral airway inserted - appropriate to patient size Laryngoscope Size: Mac and 3 Grade View: Grade I Tube type: Oral Tube size: 7.5 mm Number of attempts: 1 Airway Equipment and Method: stylet Placement Confirmation: ETT inserted through vocal cords under direct vision,  positive ETCO2 and breath sounds checked- equal and bilateral Secured at: 20 cm Tube secured with: Tape Dental Injury: Teeth and Oropharynx as per pre-operative assessment     Performed by: Rivka Barbara    Anesthesia Regional Block:  Femoral nerve block  Pre-Anesthetic Checklist: ,, timeout performed, Correct Patient, Correct Site, Correct Laterality, Correct Procedure, Correct Position, site marked, Risks and benefits discussed,  Surgical consent,  Pre-op evaluation,  At surgeon's request and post-op pain management  Laterality: Right  Prep: Maximum Sterile Barrier Precautions used and chloraprep       Needles:  Injection technique: Single-shot  Needle Type: Stimulator Needle - 80        Needle insertion depth: 8 cm   Additional Needles:  Procedures: nerve stimulator Femoral nerve block  Nerve Stimulator or Paresthesia:  Response: 0.5 mA, 0.1 ms, 8 cm  Additional Responses:   Narrative:  Start time: 10/17/2011 8:15 AM End time: 10/17/2011 8:20 AM Injection made incrementally with aspirations every 5 mL.  Performed by: Personally  Anesthesiologist: Maren Beach MD  Additional Notes: 20 cc 0.5% Marcaine w/o difficulty or discomfort  GES

## 2011-10-17 NOTE — Anesthesia Postprocedure Evaluation (Signed)
  Anesthesia Post-op Note  Patient: Hannah Castillo  Procedure(s) Performed:  TOTAL KNEE ARTHROPLASTY - 120 MINUTES FOR THIS SURGERY  Patient Location: PACU  Anesthesia Type: GA combined with regional for post-op pain  Level of Consciousness: awake, alert , oriented and patient cooperative  Airway and Oxygen Therapy: Patient Spontanous Breathing and Patient connected to nasal cannula oxygen  Post-op Pain: mild  Post-op Assessment: Post-op Vital signs reviewed, Patient's Cardiovascular Status Stable, Respiratory Function Stable, Patent Airway, No signs of Nausea or vomiting and Pain level controlled  Post-op Vital Signs: stable  Complications: No apparent anesthesia complications

## 2011-10-17 NOTE — Transfer of Care (Signed)
Immediate Anesthesia Transfer of Care Note  Patient: Hannah Castillo  Procedure(s) Performed:  TOTAL KNEE ARTHROPLASTY - 120 MINUTES FOR THIS SURGERY  Patient Location: PACU  Anesthesia Type: General  Level of Consciousness: awake  Airway & Oxygen Therapy: Patient Spontanous Breathing and Patient connected to nasal cannula oxygen  Post-op Assessment: Report given to PACU RN, Post -op Vital signs reviewed and stable and Patient moving all extremities  Post vital signs: Reviewed and stable Filed Vitals:   10/17/11 0638  BP: 130/82  Pulse: 83  Temp: 36.7 C  Resp: 20    Complications: No apparent anesthesia complications

## 2011-10-17 NOTE — Interval H&P Note (Signed)
History and Physical Interval Note:  10/17/2011 8:33 AM  Hannah Castillo  has presented today for surgery, with the diagnosis of degenerative joint disease RIGHT KNEE  The various methods of treatment have been discussed with the patient and family. After consideration of risks, benefits and other options for treatment, the patient has consented to  Procedure(s): TOTAL KNEE ARTHROPLASTY as a surgical intervention .  The patients' history has been reviewed, patient examined, no change in status, stable for surgery.  I have reviewed the patients' chart and labs.  Questions were answered to the patient's satisfaction.     MURPHY,DANIEL F

## 2011-10-17 NOTE — Preoperative (Signed)
Beta Blockers   Reason not to administer Beta Blockers: not prescribed 

## 2011-10-18 ENCOUNTER — Encounter (HOSPITAL_COMMUNITY): Payer: Self-pay | Admitting: Orthopedic Surgery

## 2011-10-18 LAB — CBC
HCT: 28.6 % — ABNORMAL LOW (ref 36.0–46.0)
MCH: 29.8 pg (ref 26.0–34.0)
MCHC: 33.2 g/dL (ref 30.0–36.0)
MCV: 89.7 fL (ref 78.0–100.0)
RDW: 13.1 % (ref 11.5–15.5)

## 2011-10-18 LAB — BASIC METABOLIC PANEL
BUN: 15 mg/dL (ref 6–23)
Calcium: 9.9 mg/dL (ref 8.4–10.5)
Creatinine, Ser: 0.82 mg/dL (ref 0.50–1.10)
GFR calc Af Amer: 81 mL/min — ABNORMAL LOW (ref >90)
GFR calc non Af Amer: 70 mL/min — ABNORMAL LOW (ref >90)

## 2011-10-18 MED ORDER — WARFARIN SODIUM 7.5 MG PO TABS
7.5000 mg | ORAL_TABLET | Freq: Once | ORAL | Status: AC
  Administered 2011-10-18: 7.5 mg via ORAL
  Filled 2011-10-18: qty 1

## 2011-10-18 MED FILL — Hydromorphone HCl Inj 1 MG/ML: INTRAMUSCULAR | Qty: 1 | Status: AC

## 2011-10-18 MED FILL — Morphine Sulfate Inj 4 MG/ML: INTRAMUSCULAR | Qty: 1 | Status: AC

## 2011-10-18 NOTE — Progress Notes (Signed)
Physical Therapy Evaluation Patient Details Name: Hannah Castillo MRN: [id number] DOB: [birth year] Today's Date: 10/18/2011  Problem List: There is no problem list on file for this patient.   Past Medical History:  Past Medical History  Diagnosis Date  . Hypothyroidism   . Rheumatoid arthritis   . Hypertension     Seen Dr. Nadara Eaton for clearnence  . PONV (postoperative nausea and vomiting)   . Degenerative joint disease    Past Surgical History:  Past Surgical History  Procedure Date  . Thyroidectomy, partial   . Appendectomy   . Abdominal hysterectomy   . Lazer eye surgery   . Cataract extraction   . Total knee arthroplasty 10/17/2011    Procedure: TOTAL KNEE ARTHROPLASTY;  Surgeon: Loreta Ave, MD;  Location: [facility] OR;  Service: Orthopedics;  Laterality: Right;  120 MINUTES FOR THIS SURGERY    PT Assessment/Plan/Recommendation PT Assessment Clinical Impression Statement: Patient s/p R TKR presenting with increased R knee pain, decreased R LE strength and ROM, now requires assist for all mobility, and has overall decreased endurance. Patient to benefit from skilled PT to maximize functional recovery. PT Recommendation/Assessment: Patient will need skilled PT in the acute care venue PT Problem List: Decreased strength;Decreased range of motion;Decreased activity tolerance;Decreased balance;Decreased mobility;Pain Barriers to Discharge:  (patient going to SNF due to patient living alone) PT Therapy Diagnosis : Difficulty walking;Abnormality of gait;Generalized weakness PT Plan PT Frequency: 7X/week PT Recommendation Follow Up Recommendations: Skilled nursing facility Equipment Recommended: Defer to next venue PT Goals  Acute Rehab PT Goals PT Goal Formulation: With patient Time For Goal Achievement: 7 days Pt will go Supine/Side to Sit: with supervision;with HOB 0 degrees PT Goal: Supine/Side to Sit - Progress: Goal set today Pt will go Sit to Stand: with supervision PT  Goal: Sit to Stand - Progress: Goal set today Pt will Ambulate: 51 - 150 feet;with supervision;with rolling walker PT Goal: Ambulate - Progress: Goal set today Pt will Perform Home Exercise Program: Independently PT Goal: Perform Home Exercise Program - Progress: Goal set today  PT Evaluation Precautions/Restrictions  Precautions Precautions: Knee Required Braces or Orthoses: Yes Knee Immobilizer: On except when in CPM Restrictions Weight Bearing Restrictions: Yes RLE Weight Bearing: Weight bearing as tolerated Prior Functioning  Home Living Lives With: Alone (patient plans to go to SNF) Type of Home: House Home Layout: Two level Alternate Level Stairs-Rails: Can reach both Home Access: Stairs to enter (patient reports "I'm going to rehab.") Home Adaptive Equipment: Bedside commode/3-in-1;Walker - rolling Prior Function Level of Independence: Independent with basic ADLs;Independent with gait;Independent with transfers Able to Take Stairs?: Yes Driving: Yes Vocation: Retired Producer, television/film/video: Awake/alert Overall Cognitive Status: Appears within functional limits for tasks assessed Orientation Level: Oriented X4 Cognition - Other Comments: pt became sleepy and dizzy during treatment Sensation/Coordination Sensation Light Touch: Appears Intact Pain: 6/10 Extremity Assessment RUE Assessment RUE Assessment: Within Functional Limits LUE Assessment LUE Assessment: Within Functional Limits RLE Assessment RLE Assessment: Not tested (due to pt s/p R TKR) LLE Assessment LLE Assessment: Within Functional Limits Mobility (including Balance) Bed Mobility Bed Mobility: Yes Supine to Sit: 2: Max assist Supine to Sit Details (indicate cue type and reason): assist for R LE managment and trunk Transfers Transfers: Yes Sit to Stand: 3: Mod assist;From bed Sit to Stand Details (indicate cue type and reason): verbal cues for hand placement, underarm assist Stand  Pivot Transfers: 3: Mod assist Stand Pivot Transfer Details (indicate cue type and reason): verbal cues  for sequencing and using increased UE support when WBing through R LE Ambulation/Gait Ambulation/Gait: Yes Ambulation/Gait Assistance: 3: Mod assist Ambulation Distance (Feet):  (stand pivot to chair) Assistive device: Rolling walker Gait Pattern: Step-to pattern Gait velocity: decreased cadence Stairs: No    Exercise  Total Joint Exercises Ankle Circles/Pumps: AROM;10 reps;Supine;Both Quad Sets: AROM;10 reps;Supine;Right Heel Slides: AAROM;10 reps;Right;Supine Knee Flexion: AROM;Right;10 reps;Supine End of Session PT - End of Session Equipment Utilized During Treatment: Gait belt Activity Tolerance:  (participation limited by onset of dizziness) Patient left: in chair;with call bell in reach;with family/visitor present Nurse Communication: Mobility status for transfers General Behavior During Session: [facility] for tasks performed Cognition: [facility] for tasks performed  Marcene Brawn 10/18/2011, 1:37 PM  Lewis Shock, PT, DPT Pager #: [phone] Office #: [phone]

## 2011-10-18 NOTE — Progress Notes (Signed)
Pt unable to void since foley d/c'd this am.  In and out cath performed obtaining 400cc clear yellow urine.  Pt tolerated procedure well.  Encouraged PO fluid intake.  Continue to monitor.

## 2011-10-18 NOTE — Progress Notes (Signed)
UR COMPLETED  

## 2011-10-18 NOTE — Progress Notes (Signed)
Patient's SNF bed has been confirmed at [facility] for Saturday. CSW Psychosocial Assessment and FL2 placed in shadow chart for MD signature in [place].  Reece Levy, MSW, Theresia Majors [phone]

## 2011-10-18 NOTE — Progress Notes (Signed)
Chart reviewed, pt. Moving slowly - planned D/C to SNF on Sat.  Will defer OT eval to SNF. If D/C plan changes will initiate OT eval.  Thanks!  Jeani Hawking, OTR/L [phone]

## 2011-10-18 NOTE — Progress Notes (Signed)
Subjective: Doing ok.  C/o right knee pain.    Objective: Vital signs in last 24 hours: Temp:  [97.5 F (36.4 C)-99.4 F (37.4 C)] 99.4 F (37.4 C) (01/17 0612) Pulse Rate:  [62-81] 81  (01/17 0612) Resp:  [12-18] 18  (01/17 0612) BP: (99-118)/(60-72) 110/72 mmHg (01/17 0612) SpO2:  [90 %-100 %] 100 % (01/17 0823) FiO2 (%):  [99 %] 99 % (01/16 1101) Weight:  [83.689 kg (184 lb 8 oz)] 83.689 kg (184 lb 8 oz) (01/16 1230)  Intake/Output from previous day: 01/16 0701 - 01/17 0700 In: 3990 [P.O.:840; I.V.:2600; IV Piggyback:550] Out: 1550 [Urine:1350; Drains:100; Blood:100] Intake/Output this shift:     Basename 10/18/11 0600  HGB 9.5*    Basename 10/18/11 0600  WBC 8.5  RBC 3.19*  HCT 28.6*  PLT 229    Basename 10/18/11 0600  NA 141  K 4.5  CL 107  CO2 30  BUN 15  CREATININE 0.82  GLUCOSE 107*  CALCIUM 9.9    Basename 10/18/11 0600  LABPT --  INR 1.13    Neurovascular intact Dressing c/d/i.   Assessment/Plan: D/c pca,  PT consults.  Anticipate d/c home fri or sat.     Hannah Castillo 10/18/2011, 9:45 AM

## 2011-10-18 NOTE — Op Note (Signed)
NAMEMarland Kitchen  OMA, MARZAN NO.:  [id number]  MEDICAL RECORD NO.:  [id number]  LOCATION:  5040                         FACILITY:  [facility]  PHYSICIAN:  Loreta Ave, M.D. DATE OF BIRTH:  [birth year]  DATE OF PROCEDURE:  10/17/2011 DATE OF DISCHARGE:                              OPERATIVE REPORT   PREOPERATIVE DIAGNOSIS:  Right knee end-stage degenerative arthritis, valgus alignment.  POSTOPERATIVE DIAGNOSIS:  Right knee end-stage degenerative arthritis, valgus alignment.  PROCEDURE:  Modified minimally invasive right total knee replacement, Stryker Triathlon prosthesis.  Cemented pegged posterior stabilized #3 femoral component.  Cemented #4 tibial component, 9-mm polyethylene insert.  Cemented resurfacing 35-mm patellar component.  SURGEON:  Loreta Ave, MD  ASSISTANT:  Zonia Kief, PA present throughout the entire case, necessary for timely completion of procedure.  ANESTHESIA:  General.  BLOOD LOSS:  Minimal.  SPECIMENS:  None.  CULTURES:  None.  COMPLICATION:  None.  DRESSINGS:  Soft compressive knee immobilizer.  DRAINS:  Hemovac x1.  TOURNIQUET TIME:  1 hour.  PROCEDURE:  The patient was brought to the operating room and placed on the operating room table in supine position.  After adequate anesthesia had been obtained the knee examined.  Increased valgus more than 10 degrees but this was correctable.  Still fairly good flexion and extension.  Tourniquet applied.  Prepped and draped in usual sterile fashion.  Exsanguinated with elevation of Esmarch, tourniquet inflated to 350 mmHg.  Straight incision above the patella down to the tibial tubercle.  Medial arthrotomy, vastus splitting.  Moderate effusion, not surprising with her underlying rheumatoid arthritis.  Medial capsular release.  Knee exposed.  Grade 4 change.  Some bone loss lateral femoral condyle.  Remnants of menisci, cruciate ligaments, spurs removed. Distal femur exposed.   Intramedullary guide placed.  Distal cut 10 mm 5 degrees of valgus.  Using epicondylar axis, the femur was sized, cut, and fitted for posterior stabilized pegged #3 component.  Proximal tibia exposed.  A 0 degree cut.  Moderate osteopenia throughout.  Debris cleared throughout the knee.  Patella exposed, posterior 10 mm removed. Drilled, sized, and fitted for a 35-mm component.  Trials put in place throughout.  #3 above, 4 below, 9-mm insert and 35 patella.  With this good biomechanical axis, nicely balanced knee in flexion and extension. Full motion, good patellar tracking.  Tibia was marked for rotation. Trials removed and hand reamed.  Copious irrigation with pulse irrigating device.  Cement prepared, placed on all components, firmly seated.  Polyethylene attached to tibia and knee reduced.  Patella clamped.  Once the cement hardened, knee was reexamined.  Again, pleased with alignment, stability, and motion.  Hemovac placed and brought out through a separate stab wound.  Arthrotomy closed with #1 Vicryl, skin and subcutaneous tissue with Vicryl and staples.  Sterile compressive dressing applied.  Tourniquet deflated and removed.  Knee immobilizer applied.  Anesthesia reversed.  Brought to the recovery room.  Tolerated the surgery well.  No complications.     Loreta Ave, M.D.     DFM/MEDQ  D:  10/17/2011  T:  10/18/2011  Job:  [id number]

## 2011-10-18 NOTE — Progress Notes (Signed)
ANTICOAGULATION CONSULT NOTE - Follow Up Consult  Pharmacy Consult for Coumadin Indication: VTE prophylaxis  Allergies  Allergen Reactions  . Hydrocodone Nausea Only    MAKES PT FEEL VERY "SICK"    Patient Measurements: Height: 5\' 5"  (165.1 cm) (from 1/11 pre-op visit) Weight: 184 lb 8 oz (83.689 kg) IBW/kg (Calculated) : 57   Vital Signs: Temp: 99.4 F (37.4 C) (01/17 0612) BP: 110/72 mmHg (01/17 0612) Pulse Rate: 81  (01/17 0612)  Labs:  Basename 10/18/11 0600  HGB 9.5*  HCT 28.6*  PLT 229  APTT --  LABPROT 14.7  INR 1.13  HEPARINUNFRC --  CREATININE 0.82  CKTOTAL --  CKMB --  TROPONINI --   Estimated Creatinine Clearance: 66.3 ml/min (by C-G formula based on Cr of 0.82).   Medications:  Scheduled:    .  ceFAZolin (ANCEF) IV  1 g Intravenous Q8H  . coumadin book   Does not apply Once  . docusate sodium  100 mg Oral BID  . enoxaparin  30 mg Subcutaneous Q12H  . lisinopril  10 mg Oral Daily   And  . hydrochlorothiazide  12.5 mg Oral Daily  . influenza  inactive virus vaccine  0.5 mL Intramuscular Tomorrow-1000  . levothyroxine  150 mcg Oral Daily  . methocarbamol(ROBAXIN) IV  500 mg Intravenous To PACU  . morphine   Intravenous To PACU  . rosuvastatin  20 mg Oral Daily  . warfarin  7.5 mg Oral ONCE-1800  . warfarin   Does not apply Once  . DISCONTD: lisinopril-hydrochlorothiazide  1 tablet Oral Daily  . DISCONTD: morphine   Intravenous Q4H    Assessment: 70 yr F s/p R TKR on 10/17/11. INR rising to goal. No bleeding reported.  Goal of Therapy:  INR 2-3   Plan:  1) Continue Coumadin 7/5mg  po x 1 today 2) Daily PT/INR 3) Overlap with Lovenox 30mg  sq q12 until INR >/=1.8  Hannah Castillo, Hannah Castillo 10/18/2011,10:40 AM

## 2011-10-19 LAB — CBC
MCH: 30 pg (ref 26.0–34.0)
MCHC: 33 g/dL (ref 30.0–36.0)
MCV: 91 fL (ref 78.0–100.0)
Platelets: 214 10*3/uL (ref 150–400)
RBC: 3 MIL/uL — ABNORMAL LOW (ref 3.87–5.11)
RDW: 13 % (ref 11.5–15.5)

## 2011-10-19 LAB — BASIC METABOLIC PANEL
CO2: 29 mEq/L (ref 19–32)
Calcium: 9.5 mg/dL (ref 8.4–10.5)
Creatinine, Ser: 0.8 mg/dL (ref 0.50–1.10)

## 2011-10-19 MED ORDER — WARFARIN SODIUM 4 MG PO TABS
4.0000 mg | ORAL_TABLET | Freq: Once | ORAL | Status: AC
  Administered 2011-10-19: 4 mg via ORAL
  Filled 2011-10-19: qty 1

## 2011-10-19 MED ORDER — INFLUENZA VIRUS VACC SPLIT PF IM SUSP
0.5000 mL | INTRAMUSCULAR | Status: AC
  Administered 2011-10-20: 0.5 mL via INTRAMUSCULAR
  Filled 2011-10-19: qty 0.5

## 2011-10-19 NOTE — Discharge Summary (Signed)
NAMEMarland Kitchen  Hannah Castillo, Hannah Castillo NO.:  [id number]  MEDICAL RECORD NO.:  [id number]  LOCATION:  5040                         FACILITY:  [facility]  PHYSICIAN:  Loreta Ave, M.D. DATE OF BIRTH:  [birth year]  DATE OF ADMISSION:  10/17/2011 DATE OF DISCHARGE:  10/20/2011                              DISCHARGE SUMMARY   FINAL DIAGNOSES: 1. Status post right total knee replacement for end-stage degenerative     joint disease. 2. Hypothyroid. 3. Hypertension. 4. Hypercholesterolemia. 5. Rheumatoid arthritis.  HISTORY OF PRESENT ILLNESS:  A 71 year old black female with history of end-stage DJD right knee and chronic pain, presented to our office for preop evaluation for total knee replacement.  She had progressive worsening pain with failed response with conservative treatment. Significant decrease in her daily activities due to the ongoing complaint.  HOSPITAL COURSE:  On October 17, 2011, the patient was taken to the [facility] OR and a right total knee replacement procedure was performed.  SURGEON:  Loreta Ave, MD  ASSISTANT:  Zonia Kief, [facility]  ANESTHESIA:  General with femoral nerve block.  BLOOD LOSS:  Minimal.  SPECIMENS:  None.  CULTURES:  None.  TOURNIQUET TIME:  73 minutes.  The patient tolerated procedure well, was transferred to recovery in stable condition.   After arriving to the Orthopedic Unit, pharmacy protocol Coumadin and Lovenox started for DVT prophylaxis.  On October 18, 2011, the patient doing well.  Complained of right knee pain.  Hemoglobin 9.5, hematocrit 28.6, sodium 141, potassium 4.5, chloride 107, CO2 30, BUN 15, creatinine 0.82, glucose 107, INR 1.13.  Right knee dressing is clean, dry, intact.  Calf nontender neurovascularly.  Skin warm and dry. Discontinued the morphine PCA.  PT consults.  Planning transfer to [facility] for rehab.  On October 19, 2011, the patient doing well with good pain control.  Right knee wound  looks good and staples intact. No drainage or sign of infection.  Calf nontender neurovascularly.  Skin warm and dry.  Hemoglobin 9.0, hematocrit 27.3, INR 1.90.  The patient doing well and should be ready for transfer to [facility] in the morning.  CONDITION:  Good and stable.  DISPOSITION:  Transfer to [facility].  MEDICATIONS: 1. Atorvastatin 40 mg 1 tab p.o. daily. 2. Synthroid 150 mcg 1 tab p.o. daily. 3. Lisinopril/hydrochlorothiazide 10/12.5 mg 1 tab p.o. daily. 4. Percocet 5/325 1-2 tabs p.o. q.4-6 hours p.r.n. for pain. 5. Robaxin 500 mg 1 tab p.o. q.6 hours p.r.n. for spasms. 6. Lovenox 30 mg 1 subcu injection q.12 hours.  And discontinue when     Coumadin is therapeutic with INR 2-3. 7. Coumadin pharmacy protocol.  Maintain INR 2-3 x4 weeks postop for     DVT prophylaxis.  INSTRUCTIONS WHILE AT [facility]:  The patient continue to work with PT and OT to improve ambulation and knee range of motion and strengthening. Weightbear as tolerated with walker and can progress single prong cane as tolerated.  CPM 0-70 degrees 6-8 hours daily and increase by 10 degrees per day as tolerated.  Daily dressing changes with 4 x 4 gauze and apply TED hose over this.  Okay to shower, but no tub soaking.  Do not apply any creams or ointments to her incision.  Recommend holding Plaquenil until 1 week postop.  Discontinue Lovenox when Coumadin is therapeutic with INR 2-3.  Needs return office visit with Dr. Eulah Pont in 2 weeks postop for recheck.  Call our office if there are any questions or concerns.     Genene Churn. Denton Meek.   ______________________________ Loreta Ave, M.D.    JMO/MEDQ  D:  10/19/2011  T:  10/19/2011  Job:  [id number]

## 2011-10-19 NOTE — Progress Notes (Signed)
Physical Therapy Treatment Patient Details Name: Hannah Castillo MRN: [id number] DOB: 1941/09/18 Today's Date: 10/19/2011  PT Assessment/Plan  PT - Assessment/Plan PT Plan: Discharge plan remains appropriate PT Frequency: 7X/week Follow Up Recommendations: Skilled nursing facility Equipment Recommended: Defer to next venue PT Goals  Acute Rehab PT Goals PT Goal: Supine/Side to Sit - Progress: Progressing toward goal PT Goal: Sit to Stand - Progress: Progressing toward goal PT Goal: Ambulate - Progress: Progressing toward goal PT Goal: Perform Home Exercise Program - Progress: Progressing toward goal  PT Treatment Precautions/Restrictions  Precautions Precautions: Knee Required Braces or Orthoses: Yes Knee Immobilizer: On except when in CPM Restrictions Weight Bearing Restrictions: Yes RLE Weight Bearing: Weight bearing as tolerated Mobility (including Balance) Bed Mobility Bed Mobility: Yes Supine to Sit: 4: Min assist Supine to Sit Details (indicate cue type and reason): min assist for R LE management.  HOB 30 degrees. Transfers Transfers: Yes Sit to Stand: 4: Min assist Sit to Stand Details (indicate cue type and reason): Verbal cues for hand placement. Ambulation/Gait Ambulation/Gait: Yes Ambulation/Gait Assistance: 4: Min assist Ambulation/Gait Assistance Details (indicate cue type and reason): Min guard assist and verbal cues for sequence Ambulation Distance (Feet): 20 Feet (times 2) Assistive device: Rolling walker Gait Pattern: Step-to pattern Gait velocity: decreased cadence Stairs: No    Exercise  Total Joint Exercises Ankle Circles/Pumps: AROM;10 reps;Supine;Both Quad Sets: AROM;10 reps;Supine;Right Short Arc QuadBarbaraann Boys;Right;10 reps;Supine Heel Slides: AAROM;10 reps;Right;Supine Hip ABduction/ADduction: AAROM;Right;10 reps;Supine End of Session PT - End of Session Equipment Utilized During Treatment: Gait belt Activity Tolerance: Patient  tolerated treatment well Patient left: in bed;with call bell in reach Nurse Communication: Mobility status for transfers General Behavior During Session: [facility] for tasks performed Cognition: [facility] for tasks performed  Newell Coral 10/19/2011, 10:27 AM  Newell Coral, PTA

## 2011-10-19 NOTE — Progress Notes (Signed)
Physical Therapy Treatment Patient Details Name: Hannah Castillo MRN: [id number] DOB: July 30, 1941 Today's Date: 10/19/2011  PT Assessment/Plan  PT - Assessment/Plan Comments on Treatment Session: patient with improved ambulation tolerance PT Plan: Discharge plan remains appropriate PT Frequency: 7X/week PT Goals  Acute Rehab PT Goals PT Goal: Supine/Side to Sit - Progress: Progressing toward goal PT Goal: Sit to Stand - Progress: Progressing toward goal PT Goal: Ambulate - Progress: Progressing toward goal PT Goal: Perform Home Exercise Program - Progress: Progressing toward goal  PT Treatment Precautions/Restrictions  Precautions Precautions: Knee Required Braces or Orthoses: Yes Knee Immobilizer: On except when in CPM Restrictions Weight Bearing Restrictions: Yes RLE Weight Bearing: Weight bearing as tolerated Mobility (including Balance) Bed Mobility Bed Mobility:  (pt received up in chair) Transfers Sit to Stand: 4: Min assist Sit to Stand Details (indicate cue type and reason): vcs for hand placement Ambulation/Gait Ambulation/Gait: Yes Ambulation/Gait Assistance: 4: Min assist Ambulation Distance (Feet): 30 Feet (limited to R knee pain) Assistive device: Rolling walker Gait Pattern: Step-to pattern;Decreased step length - right;Decreased stance time - right;Antalgic (with R KI) Gait velocity: decreased cadence    Pain: 7/10  RN provided pain meds  Exercise  Total Joint Exercises Ankle Circles/Pumps: AROM;Both;20 reps Quad Sets: AROM;Right;20 reps Heel Slides: AAROM;Right;20 reps Knee Flexion: AAROM;Right;20 reps End of Session PT - End of Session Equipment Utilized During Treatment: Gait belt Activity Tolerance: Patient tolerated treatment well Patient left: in bed;with call bell in reach General Behavior During Session: [name] for tasks performed Cognition: [facility] for tasks performed  Marcene Brawn 10/19/2011, 2:53 PM  Lewis Shock, PT,  DPT Pager #: [phone] Office #: [phone]

## 2011-10-19 NOTE — Progress Notes (Signed)
Subjective: Doing well.  Pain controlled. Objective: Vital signs in last 24 hours: Temp:  [98 F (36.7 C)-99 F (37.2 C)] 99 F (37.2 C) (01/18 0600) Pulse Rate:  [72-99] 87  (01/18 0600) Resp:  [18] 18  (01/18 0600) BP: (102-129)/(58-77) 106/66 mmHg (01/18 0600) SpO2:  [96 %-100 %] 100 % (01/18 0600)  Intake/Output from previous day: 01/17 0701 - 01/18 0700 In: 600 [P.O.:600] Out: 800 [Urine:650; Drains:150] Intake/Output this shift:     Basename 10/19/11 0600 10/18/11 0600  HGB 9.0* 9.5*    Basename 10/19/11 0600 10/18/11 0600  WBC 8.5 8.5  RBC 3.00* 3.19*  HCT 27.3* 28.6*  PLT 214 229    Basename 10/19/11 0600 10/18/11 0600  NA 139 141  K 4.0 4.5  CL 105 107  CO2 29 30  BUN 11 15  CREATININE 0.80 0.82  GLUCOSE 96 107*  CALCIUM 9.5 9.9    Basename 10/19/11 0600 10/18/11 0600  LABPT -- --  INR 1.90* 1.13    Neurovascular intact Wound looks good.  Staples intact.  No drainage or signs of infection.  Calf nontender. Drain removed.  Assessment/Plan: Transfer to [facility] tomorrow.  D/c foley.  Saline lock iv.   Rockey Guarino M 10/19/2011, 8:35 AM

## 2011-10-19 NOTE — Progress Notes (Signed)
ANTICOAGULATION CONSULT NOTE - Follow Up Consult  Pharmacy Consult for Coumadin Indication: VTE prophylaxis  Allergies  Allergen Reactions  . Hydrocodone Nausea Only    MAKES PT FEEL VERY "SICK"    Patient Measurements: Height: 5\' 5"  (165.1 cm) (from 1/11 pre-op visit) Weight: 184 lb 8 oz (83.689 kg) IBW/kg (Calculated) : 57   Vital Signs: Temp: 99 F (37.2 C) (01/18 0600) Temp src: Oral (01/18 0600) BP: 101/57 mmHg (01/18 1034) Pulse Rate: 96  (01/18 1034)  Labs:  Basename 10/19/11 0600 10/18/11 0600  HGB 9.0* 9.5*  HCT 27.3* 28.6*  PLT 214 229  APTT -- --  LABPROT 22.1* 14.7  INR 1.90* 1.13  HEPARINUNFRC -- --  CREATININE 0.80 0.82  CKTOTAL -- --  CKMB -- --  TROPONINI -- --   Estimated Creatinine Clearance: 67.9 ml/min (by C-G formula based on Cr of 0.8).   Medications:  Scheduled:     . docusate sodium  100 mg Oral BID  . enoxaparin  30 mg Subcutaneous Q12H  . lisinopril  10 mg Oral Daily   And  . hydrochlorothiazide  12.5 mg Oral Daily  . influenza  inactive virus vaccine  0.5 mL Intramuscular Tomorrow-1000  . levothyroxine  150 mcg Oral Daily  . rosuvastatin  20 mg Oral Daily  . warfarin  7.5 mg Oral ONCE-1800  . warfarin   Does not apply Once    Assessment: 40 yr F s/p R TKR on 10/17/11. Large jump in INR overnight after 2 doses of warfarin 7.5mg . No bleeding reported.  Goal of Therapy:  INR 2-3   Plan:  1) Decrease Coumadin 4mg  po x 1 today 2) Daily PT/INR 3) D/C Lovenox since INR >1.8  Javonda Suh, [facility] 10/19/2011,10:37 AM

## 2011-10-20 LAB — BASIC METABOLIC PANEL
BUN: 11 mg/dL (ref 6–23)
Calcium: 9.1 mg/dL (ref 8.4–10.5)
GFR calc non Af Amer: 82 mL/min — ABNORMAL LOW (ref >90)
Glucose, Bld: 93 mg/dL (ref 70–99)
Sodium: 139 mEq/L (ref 135–145)

## 2011-10-20 LAB — CBC
HCT: 24.8 % — ABNORMAL LOW (ref 36.0–46.0)
Hemoglobin: 8.3 g/dL — ABNORMAL LOW (ref 12.0–15.0)
MCH: 30.1 pg (ref 26.0–34.0)
MCHC: 33.5 g/dL (ref 30.0–36.0)

## 2011-10-20 LAB — PROTIME-INR
Prothrombin Time: 41.2 seconds — ABNORMAL HIGH (ref 11.6–15.2)
Prothrombin Time: 39.2 seconds — ABNORMAL HIGH (ref 11.6–15.2)

## 2011-10-20 MED ORDER — BISACODYL 10 MG RE SUPP
10.0000 mg | Freq: Every day | RECTAL | Status: DC | PRN
  Administered 2011-10-20: 10 mg via RECTAL
  Filled 2011-10-20: qty 1

## 2011-10-20 NOTE — Progress Notes (Signed)
Physical Therapy Treatment Patient Details Name: Hannah Castillo MRN: [id number] DOB: 1941-05-04 Today's Date: 10/20/2011  PT Assessment/Plan  PT - Assessment/Plan Comments on Treatment Session: Patient improving with increased ambulation distance as well as tolerance with exercises.  PT Plan: Discharge plan remains appropriate PT Frequency: 7X/week Follow Up Recommendations: Skilled nursing facility Equipment Recommended: Defer to next venue PT Goals  Acute Rehab PT Goals PT Goal Formulation: With patient PT Goal: Supine/Side to Sit - Progress: Progressing toward goal PT Goal: Sit to Stand - Progress: Progressing toward goal PT Goal: Ambulate - Progress: Progressing toward goal PT Goal: Perform Home Exercise Program - Progress: Progressing toward goal  PT Treatment Precautions/Restrictions  Precautions Precautions: Knee Required Braces or Orthoses: Yes Knee Immobilizer: On except when in CPM Restrictions Weight Bearing Restrictions: Yes RLE Weight Bearing: Weight bearing as tolerated Mobility (including Balance) Bed Mobility Bed Mobility: No (pt up with nurse tech going to bathroom upon arrival) Transfers Transfers: Yes Sit to Stand: 4: Min assist;From chair/3-in-1 Sit to Stand Details (indicate cue type and reason): VC for hand placement and safety with RW Stand to Sit: 4: Min assist;With upper extremity assist;To chair/3-in-1 Stand to Sit Details: VC for hand placement and LE placement.  Ambulation/Gait Ambulation/Gait: Yes Ambulation/Gait Assistance: 4: Min assist Ambulation/Gait Assistance Details (indicate cue type and reason): Minguard assist. VC for increased right step length as well as postural cues throughout Ambulation Distance (Feet): 75 Feet Assistive device: Rolling walker Gait Pattern: Step-to pattern;Decreased step length - right;Decreased stance time - right;Antalgic Gait velocity: decreased cadence Stairs: No    Exercise  Total Joint  Exercises Quad Sets: AROM;Right;10 reps;Supine Heel Slides: AAROM;Right;10 reps;Supine Straight Leg Raises: AAROM;Strengthening;Right;10 reps;Supine End of Session PT - End of Session Equipment Utilized During Treatment: Gait belt;Right knee immobilizer Activity Tolerance: Patient tolerated treatment well Patient left: in bed;with call bell in reach Nurse Communication: Mobility status for transfers General Behavior During Session: [facility] for tasks performed Cognition: [facility] for tasks performed  [name] 10/20/2011, 9:41 AM  10/20/2011 [name] DPT PAGER: [phone] OFFICE: [phone]

## 2011-10-20 NOTE — Progress Notes (Signed)
Subjective: 3 Days Post-Op Procedure(s) (LRB): TOTAL KNEE ARTHROPLASTY (Right) Patient reports pain as mild.  No abdominal pain, +Flatus, but pt states she has not had BM since prior to surgery.  Is tolerating diet without N/V.  Objective: Vital signs in last 24 hours: Temp:  [97.9 F (36.6 C)-99.1 F (37.3 C)] 98.6 F (37 C) (01/19 0509) Pulse Rate:  [85-96] 85  (01/19 0509) Resp:  [18] 18  (01/19 0509) BP: (96-118)/(52-63) 96/58 mmHg (01/19 0509) SpO2:  [97 %-100 %] 98 % (01/19 0509)  Intake/Output from previous day: 01/18 0701 - 01/19 0700 In: 240 [P.O.:240] Out: 200 [Urine:200] Intake/Output this shift:     Basename 10/20/11 0645 10/19/11 0600 10/18/11 0600  HGB 8.3* 9.0* 9.5*    Basename 10/20/11 0645 10/19/11 0600  WBC 6.4 8.5  RBC 2.76* 3.00*  HCT 24.8* 27.3*  PLT 209 214    Basename 10/20/11 0645 10/19/11 0600  NA 139 139  K 3.6 4.0  CL 104 105  CO2 31 29  BUN 11 11  CREATININE 0.77 0.80  GLUCOSE 93 96  CALCIUM 9.1 9.5    Basename 10/20/11 0645 10/19/11 0600  LABPT -- --  INR 4.21* 1.90*    Neurovascular intact Sensation intact distally Dorsiflexion/Plantar flexion intact Incision: dressing C/D/I and no drainage No cellulitis present  Assessment/Plan: 3 Days Post-Op Procedure(s) (LRB): TOTAL KNEE ARTHROPLASTY (Right) Redraw INR, if still highly elevated will make med adjustments, will also plan on pt staying so that we may monitor and check again tomorrow. If INR in appropriate range will continue with d/c to SNF. We will give something to help with BM    Hannah Castillo, Hannah Castillo 10/20/2011, 10:06 AM

## 2011-10-20 NOTE — Plan of Care (Signed)
Problem: Phase III Progression Outcomes Goal: Anticoagulant follow-up in place Outcome: Progressing INR 4.21 today. Hold D/C to recheck in AM

## 2011-10-20 NOTE — Progress Notes (Signed)
ANTICOAGULATION CONSULT NOTE - Follow Up Consult  Pharmacy Consult for Coumadin Indication: VTE prophylaxis  Allergies  Allergen Reactions  . Hydrocodone Nausea Only    MAKES PT FEEL VERY "SICK"    Patient Measurements: Height: 5\' 5"  (165.1 cm) (from 1/11 pre-op visit) Weight: 184 lb 8 oz (83.689 kg) IBW/kg (Calculated) : 57   Vital Signs: Temp: 98.6 F (37 C) (01/19 0509) BP: 96/58 mmHg (01/19 0509) Pulse Rate: 85  (01/19 0509)  Labs:  Basename 10/20/11 1057 10/20/11 0645 10/19/11 0600 10/18/11 0600  HGB -- 8.3* 9.0* --  HCT -- 24.8* 27.3* 28.6*  PLT -- 209 214 229  APTT -- -- -- --  LABPROT 39.2* 41.2* 22.1* --  INR 3.95* 4.21* 1.90* --  HEPARINUNFRC -- -- -- --  CREATININE -- 0.77 0.80 0.82  CKTOTAL -- -- -- --  CKMB -- -- -- --  TROPONINI -- -- -- --   Estimated Creatinine Clearance: 67.9 ml/min (by C-G formula based on Cr of 0.77).   Medications:  Scheduled:     . docusate sodium  100 mg Oral BID  . lisinopril  10 mg Oral Daily   And  . hydrochlorothiazide  12.5 mg Oral Daily  . influenza  inactive virus vaccine  0.5 mL Intramuscular Tomorrow-1000  . influenza  inactive virus vaccine  0.5 mL Intramuscular Tomorrow-1000  . levothyroxine  150 mcg Oral Daily  . rosuvastatin  20 mg Oral Daily  . warfarin  4 mg Oral ONCE-1800  . warfarin   Does not apply Once    Assessment: 65 yr F s/p R TKR on 10/17/11. INR now supratherapeutic despite dose reduction.  No bleeding reported.  Goal of Therapy:  INR 2-3   Plan:  1)Hold Coumadin tonight 2) F/U daily PT/INR *3) Did not appear that Coumadin was on discharge summary.  Would recommend restart Coumadin at 2mg  po daily once INR lowered with close INR monitoring at [facility].  Elson Clan 10/20/2011,12:41 PM

## 2011-10-21 DIAGNOSIS — E039 Hypothyroidism, unspecified: Secondary | ICD-10-CM | POA: Diagnosis not present

## 2011-10-21 DIAGNOSIS — I10 Essential (primary) hypertension: Secondary | ICD-10-CM | POA: Diagnosis not present

## 2011-10-21 DIAGNOSIS — M1710 Unilateral primary osteoarthritis, unspecified knee: Secondary | ICD-10-CM | POA: Diagnosis not present

## 2011-10-21 DIAGNOSIS — Z5189 Encounter for other specified aftercare: Secondary | ICD-10-CM | POA: Diagnosis not present

## 2011-10-21 DIAGNOSIS — E7800 Pure hypercholesterolemia, unspecified: Secondary | ICD-10-CM | POA: Diagnosis not present

## 2011-10-21 DIAGNOSIS — M069 Rheumatoid arthritis, unspecified: Secondary | ICD-10-CM | POA: Diagnosis not present

## 2011-10-21 DIAGNOSIS — E785 Hyperlipidemia, unspecified: Secondary | ICD-10-CM | POA: Diagnosis not present

## 2011-10-21 DIAGNOSIS — M25569 Pain in unspecified knee: Secondary | ICD-10-CM | POA: Diagnosis not present

## 2011-10-21 DIAGNOSIS — Z471 Aftercare following joint replacement surgery: Secondary | ICD-10-CM | POA: Diagnosis not present

## 2011-10-21 DIAGNOSIS — S8990XA Unspecified injury of unspecified lower leg, initial encounter: Secondary | ICD-10-CM | POA: Diagnosis not present

## 2011-10-21 DIAGNOSIS — K5900 Constipation, unspecified: Secondary | ICD-10-CM | POA: Diagnosis not present

## 2011-10-21 DIAGNOSIS — Z96659 Presence of unspecified artificial knee joint: Secondary | ICD-10-CM | POA: Diagnosis not present

## 2011-10-21 LAB — CBC
HCT: 24.9 % — ABNORMAL LOW (ref 36.0–46.0)
Hemoglobin: 8.2 g/dL — ABNORMAL LOW (ref 12.0–15.0)
MCH: 29.9 pg (ref 26.0–34.0)
MCHC: 32.9 g/dL (ref 30.0–36.0)

## 2011-10-21 LAB — PROTIME-INR: Prothrombin Time: 33.2 seconds — ABNORMAL HIGH (ref 11.6–15.2)

## 2011-10-21 NOTE — Progress Notes (Signed)
ANTICOAGULATION CONSULT NOTE - Follow Up Consult  Pharmacy Consult for Coumadin Indication: VTE prophylaxis  Allergies  Allergen Reactions  . Hydrocodone Nausea Only    MAKES PT FEEL VERY "SICK"    Patient Measurements: Height: 5\' 5"  (165.1 cm) (from 1/11 pre-op visit) Weight: 184 lb 8 oz (83.689 kg) IBW/kg (Calculated) : 57   Vital Signs: Temp: 98.4 F (36.9 C) (01/20 0539) Temp src: Oral (01/20 0539) BP: 101/72 mmHg (01/20 0539) Pulse Rate: 85  (01/20 0539)  Labs:  Basename 10/21/11 0720 10/20/11 1057 10/20/11 0645 10/19/11 0600  HGB 8.2* -- 8.3* --  HCT 24.9* -- 24.8* 27.3*  PLT 247 -- 209 214  APTT -- -- -- --  LABPROT 33.2* 39.2* 41.2* --  INR 3.19* 3.95* 4.21* --  HEPARINUNFRC -- -- -- --  CREATININE -- -- 0.77 0.80  CKTOTAL -- -- -- --  CKMB -- -- -- --  TROPONINI -- -- -- --   Estimated Creatinine Clearance: 67.9 ml/min (by C-G formula based on Cr of 0.77).   Medications:  Scheduled:     . docusate sodium  100 mg Oral BID  . lisinopril  10 mg Oral Daily   And  . hydrochlorothiazide  12.5 mg Oral Daily  . influenza  inactive virus vaccine  0.5 mL Intramuscular Tomorrow-1000  . influenza  inactive virus vaccine  0.5 mL Intramuscular Tomorrow-1000  . levothyroxine  150 mcg Oral Daily  . rosuvastatin  20 mg Oral Daily  . warfarin   Does not apply Once    Assessment: 66 yr F s/p R TKR on 10/17/11. INR still supratherapeutic. Dose held 1/19.  No bleeding reported. CBC stable overnight.   Goal of Therapy:  INR 2-3   Plan:  1)Hold Coumadin again tonight 2) F/U daily PT/INR *3) Did not appear that Coumadin was on discharge summary.  Would recommend restart Coumadin at 2mg  po daily on 1/21.  Elson Clan 10/21/2011,11:09 AM

## 2011-10-21 NOTE — Progress Notes (Signed)
Physical Therapy Treatment Patient Details Name: Hannah Castillo MRN: [id number] DOB: April 15, 1941 Today's Date: 10/21/2011  PT Assessment/Plan  PT - Assessment/Plan Comments on Treatment Session: Pt improving. She is at a supervision level/min guard for all mobility. Pt still with RLE weakness during exercises. Plan to d/c today or tomorrow PT Plan: Discharge plan remains appropriate PT Frequency: 7X/week Follow Up Recommendations: Skilled nursing facility Equipment Recommended: Defer to next venue PT Goals  Acute Rehab PT Goals PT Goal Formulation: With patient PT Goal: Supine/Side to Sit - Progress: Met PT Goal: Sit to Stand - Progress: Met PT Goal: Ambulate - Progress: Progressing toward goal PT Goal: Perform Home Exercise Program - Progress: Progressing toward goal  PT Treatment Precautions/Restrictions  Precautions Precautions: Knee Required Braces or Orthoses: Yes Knee Immobilizer: On except when in CPM Restrictions Weight Bearing Restrictions: Yes RLE Weight Bearing: Weight bearing as tolerated Mobility (including Balance) Bed Mobility Bed Mobility: Yes Supine to Sit: 5: Supervision Supine to Sit Details (indicate cue type and reason): VC for sequencing Sitting - Scoot to Edge of Bed: 6: Modified independent (Device/Increase time) Sit to Supine: 5: Supervision Sit to Supine - Details (indicate cue type and reason): VC for sequencing and hand placement Scooting to [facility]: 5: Supervision;With trapeze Scooting to [facility] Details (indicate cue type and reason): VC for hand placement. Pt unable to scoot without trapeze Transfers Transfers: Yes Sit to Stand: 5: Supervision Sit to Stand Details (indicate cue type and reason): VC for hand placement for safety with RW Stand to Sit: 5: Supervision Stand to Sit Details: VC for hand placement for safety with RW Ambulation/Gait Ambulation/Gait: Yes Ambulation/Gait Assistance: 4: Min assist (Minguard assist) Ambulation/Gait  Assistance Details (indicate cue type and reason): VC for safety with distance to RW as wel as for even step length Ambulation Distance (Feet): 120 Feet Assistive device: Rolling walker Gait Pattern: Step-to pattern;Decreased step length - right;Decreased stance time - right;Antalgic Gait velocity: decreased cadence Stairs: No    Exercise  Total Joint Exercises Heel Slides: AROM;Strengthening;Right;10 reps;Seated Hip ABduction/ADduction: AAROM;Strengthening;Right;10 reps;Supine Straight Leg Raises: AAROM;Strengthening;Right;10 reps;Supine Long Arc Quad: Strengthening;Right;10 reps;Seated;AROM End of Session PT - End of Session Equipment Utilized During Treatment: Gait belt;Right knee immobilizer Activity Tolerance: Patient tolerated treatment well Patient left: in bed;with call bell in reach;in CPM Nurse Communication: Mobility status for transfers General Behavior During Session: [facility] for tasks performed Cognition: [facility] for tasks performed  [name] 10/21/2011, 1:24 PM  10/21/2011 [name] DPT PAGER: [phone] OFFICE: [phone]

## 2011-10-21 NOTE — Progress Notes (Signed)
Subjective: 4 Days Post-Op Procedure(s) (LRB): TOTAL KNEE ARTHROPLASTY (Right) Patient reports pain as mild.    Objective: Vital signs in last 24 hours: Temp:  [98 F (36.7 C)-98.9 F (37.2 C)] 98.4 F (36.9 C) (01/20 0539) Pulse Rate:  [85-100] 85  (01/20 0539) Resp:  [18] 18  (01/20 0539) BP: (101-112)/(61-75) 101/72 mmHg (01/20 0539) SpO2:  [96 %-100 %] 96 % (01/20 0539)  Intake/Output from previous day: 01/19 0701 - 01/20 0700 In: 480 [P.O.:480] Out: -  Intake/Output this shift:     Basename 10/21/11 0720 10/20/11 0645 10/19/11 0600  HGB 8.2* 8.3* 9.0*    Basename 10/21/11 0720 10/20/11 0645  WBC 7.0 6.4  RBC 2.74* 2.76*  HCT 24.9* 24.8*  PLT 247 209    Basename 10/20/11 0645 10/19/11 0600  NA 139 139  K 3.6 4.0  CL 104 105  CO2 31 29  BUN 11 11  CREATININE 0.77 0.80  GLUCOSE 93 96  CALCIUM 9.1 9.5    Basename 10/21/11 0720 10/20/11 1057  LABPT -- --  INR 3.19* 3.95*    Dorsiflexion/Plantar flexion intact Incision: dressing C/D/I  Assessment/Plan: 4 Days Post-Op Procedure(s) (LRB): TOTAL KNEE ARTHROPLASTY (Right) Discharge to SNF Hold coumadin again today, per pharmacy recommendation restart tomorrow 2mg  po daily, daily PT/INR check.  Margart Sickles 10/21/2011, 12:43 PM

## 2011-10-21 NOTE — Progress Notes (Signed)
Pt D/C'ed to [facility] via [facility] in stable condition. Daughter present at time of transfer. All belongings sent with patient.

## 2011-10-25 DIAGNOSIS — I10 Essential (primary) hypertension: Secondary | ICD-10-CM | POA: Diagnosis not present

## 2011-10-25 DIAGNOSIS — E039 Hypothyroidism, unspecified: Secondary | ICD-10-CM | POA: Diagnosis not present

## 2011-10-25 DIAGNOSIS — E7800 Pure hypercholesterolemia, unspecified: Secondary | ICD-10-CM | POA: Diagnosis not present

## 2011-10-30 DIAGNOSIS — Z471 Aftercare following joint replacement surgery: Secondary | ICD-10-CM | POA: Diagnosis not present

## 2011-11-04 DIAGNOSIS — Z96659 Presence of unspecified artificial knee joint: Secondary | ICD-10-CM | POA: Diagnosis not present

## 2011-11-04 DIAGNOSIS — Z471 Aftercare following joint replacement surgery: Secondary | ICD-10-CM | POA: Diagnosis not present

## 2011-11-04 DIAGNOSIS — I10 Essential (primary) hypertension: Secondary | ICD-10-CM | POA: Diagnosis not present

## 2011-11-04 DIAGNOSIS — M069 Rheumatoid arthritis, unspecified: Secondary | ICD-10-CM | POA: Diagnosis not present

## 2011-11-04 DIAGNOSIS — H543 Unqualified visual loss, both eyes: Secondary | ICD-10-CM | POA: Diagnosis not present

## 2011-11-05 DIAGNOSIS — M069 Rheumatoid arthritis, unspecified: Secondary | ICD-10-CM | POA: Diagnosis not present

## 2011-11-05 DIAGNOSIS — I10 Essential (primary) hypertension: Secondary | ICD-10-CM | POA: Diagnosis not present

## 2011-11-05 DIAGNOSIS — H543 Unqualified visual loss, both eyes: Secondary | ICD-10-CM | POA: Diagnosis not present

## 2011-11-05 DIAGNOSIS — Z96659 Presence of unspecified artificial knee joint: Secondary | ICD-10-CM | POA: Diagnosis not present

## 2011-11-05 DIAGNOSIS — Z471 Aftercare following joint replacement surgery: Secondary | ICD-10-CM | POA: Diagnosis not present

## 2011-11-06 DIAGNOSIS — Z96659 Presence of unspecified artificial knee joint: Secondary | ICD-10-CM | POA: Diagnosis not present

## 2011-11-06 DIAGNOSIS — I10 Essential (primary) hypertension: Secondary | ICD-10-CM | POA: Diagnosis not present

## 2011-11-06 DIAGNOSIS — H543 Unqualified visual loss, both eyes: Secondary | ICD-10-CM | POA: Diagnosis not present

## 2011-11-06 DIAGNOSIS — M069 Rheumatoid arthritis, unspecified: Secondary | ICD-10-CM | POA: Diagnosis not present

## 2011-11-06 DIAGNOSIS — Z471 Aftercare following joint replacement surgery: Secondary | ICD-10-CM | POA: Diagnosis not present

## 2011-11-08 DIAGNOSIS — M069 Rheumatoid arthritis, unspecified: Secondary | ICD-10-CM | POA: Diagnosis not present

## 2011-11-08 DIAGNOSIS — H543 Unqualified visual loss, both eyes: Secondary | ICD-10-CM | POA: Diagnosis not present

## 2011-11-08 DIAGNOSIS — Z96659 Presence of unspecified artificial knee joint: Secondary | ICD-10-CM | POA: Diagnosis not present

## 2011-11-08 DIAGNOSIS — I10 Essential (primary) hypertension: Secondary | ICD-10-CM | POA: Diagnosis not present

## 2011-11-08 DIAGNOSIS — Z471 Aftercare following joint replacement surgery: Secondary | ICD-10-CM | POA: Diagnosis not present

## 2011-11-12 DIAGNOSIS — M069 Rheumatoid arthritis, unspecified: Secondary | ICD-10-CM | POA: Diagnosis not present

## 2011-11-12 DIAGNOSIS — H543 Unqualified visual loss, both eyes: Secondary | ICD-10-CM | POA: Diagnosis not present

## 2011-11-12 DIAGNOSIS — I10 Essential (primary) hypertension: Secondary | ICD-10-CM | POA: Diagnosis not present

## 2011-11-12 DIAGNOSIS — Z471 Aftercare following joint replacement surgery: Secondary | ICD-10-CM | POA: Diagnosis not present

## 2011-11-12 DIAGNOSIS — Z96659 Presence of unspecified artificial knee joint: Secondary | ICD-10-CM | POA: Diagnosis not present

## 2011-11-14 DIAGNOSIS — I10 Essential (primary) hypertension: Secondary | ICD-10-CM | POA: Diagnosis not present

## 2011-11-14 DIAGNOSIS — Z96659 Presence of unspecified artificial knee joint: Secondary | ICD-10-CM | POA: Diagnosis not present

## 2011-11-14 DIAGNOSIS — M069 Rheumatoid arthritis, unspecified: Secondary | ICD-10-CM | POA: Diagnosis not present

## 2011-11-14 DIAGNOSIS — H543 Unqualified visual loss, both eyes: Secondary | ICD-10-CM | POA: Diagnosis not present

## 2011-11-14 DIAGNOSIS — Z471 Aftercare following joint replacement surgery: Secondary | ICD-10-CM | POA: Diagnosis not present

## 2011-11-16 DIAGNOSIS — H543 Unqualified visual loss, both eyes: Secondary | ICD-10-CM | POA: Diagnosis not present

## 2011-11-16 DIAGNOSIS — I10 Essential (primary) hypertension: Secondary | ICD-10-CM | POA: Diagnosis not present

## 2011-11-16 DIAGNOSIS — Z471 Aftercare following joint replacement surgery: Secondary | ICD-10-CM | POA: Diagnosis not present

## 2011-11-16 DIAGNOSIS — M069 Rheumatoid arthritis, unspecified: Secondary | ICD-10-CM | POA: Diagnosis not present

## 2011-11-16 DIAGNOSIS — Z96659 Presence of unspecified artificial knee joint: Secondary | ICD-10-CM | POA: Diagnosis not present

## 2011-11-19 DIAGNOSIS — Z96659 Presence of unspecified artificial knee joint: Secondary | ICD-10-CM | POA: Diagnosis not present

## 2011-11-19 DIAGNOSIS — Z471 Aftercare following joint replacement surgery: Secondary | ICD-10-CM | POA: Diagnosis not present

## 2011-11-19 DIAGNOSIS — I10 Essential (primary) hypertension: Secondary | ICD-10-CM | POA: Diagnosis not present

## 2011-11-19 DIAGNOSIS — M069 Rheumatoid arthritis, unspecified: Secondary | ICD-10-CM | POA: Diagnosis not present

## 2011-11-19 DIAGNOSIS — H543 Unqualified visual loss, both eyes: Secondary | ICD-10-CM | POA: Diagnosis not present

## 2011-11-22 DIAGNOSIS — M069 Rheumatoid arthritis, unspecified: Secondary | ICD-10-CM | POA: Diagnosis not present

## 2011-11-22 DIAGNOSIS — I10 Essential (primary) hypertension: Secondary | ICD-10-CM | POA: Diagnosis not present

## 2011-11-22 DIAGNOSIS — Z96659 Presence of unspecified artificial knee joint: Secondary | ICD-10-CM | POA: Diagnosis not present

## 2011-11-22 DIAGNOSIS — H543 Unqualified visual loss, both eyes: Secondary | ICD-10-CM | POA: Diagnosis not present

## 2011-11-22 DIAGNOSIS — Z471 Aftercare following joint replacement surgery: Secondary | ICD-10-CM | POA: Diagnosis not present

## 2011-11-23 DIAGNOSIS — M069 Rheumatoid arthritis, unspecified: Secondary | ICD-10-CM | POA: Diagnosis not present

## 2011-11-23 DIAGNOSIS — H543 Unqualified visual loss, both eyes: Secondary | ICD-10-CM | POA: Diagnosis not present

## 2011-11-23 DIAGNOSIS — I10 Essential (primary) hypertension: Secondary | ICD-10-CM | POA: Diagnosis not present

## 2011-11-23 DIAGNOSIS — Z471 Aftercare following joint replacement surgery: Secondary | ICD-10-CM | POA: Diagnosis not present

## 2011-11-23 DIAGNOSIS — Z96659 Presence of unspecified artificial knee joint: Secondary | ICD-10-CM | POA: Diagnosis not present

## 2011-11-26 DIAGNOSIS — H543 Unqualified visual loss, both eyes: Secondary | ICD-10-CM | POA: Diagnosis not present

## 2011-11-26 DIAGNOSIS — I10 Essential (primary) hypertension: Secondary | ICD-10-CM | POA: Diagnosis not present

## 2011-11-26 DIAGNOSIS — Z96659 Presence of unspecified artificial knee joint: Secondary | ICD-10-CM | POA: Diagnosis not present

## 2011-11-26 DIAGNOSIS — Z471 Aftercare following joint replacement surgery: Secondary | ICD-10-CM | POA: Diagnosis not present

## 2011-11-26 DIAGNOSIS — M069 Rheumatoid arthritis, unspecified: Secondary | ICD-10-CM | POA: Diagnosis not present

## 2011-11-27 DIAGNOSIS — M1710 Unilateral primary osteoarthritis, unspecified knee: Secondary | ICD-10-CM | POA: Diagnosis not present

## 2011-11-27 DIAGNOSIS — Z471 Aftercare following joint replacement surgery: Secondary | ICD-10-CM | POA: Diagnosis not present

## 2011-11-28 DIAGNOSIS — H543 Unqualified visual loss, both eyes: Secondary | ICD-10-CM | POA: Diagnosis not present

## 2011-11-28 DIAGNOSIS — M069 Rheumatoid arthritis, unspecified: Secondary | ICD-10-CM | POA: Diagnosis not present

## 2011-11-28 DIAGNOSIS — I10 Essential (primary) hypertension: Secondary | ICD-10-CM | POA: Diagnosis not present

## 2011-11-28 DIAGNOSIS — Z96659 Presence of unspecified artificial knee joint: Secondary | ICD-10-CM | POA: Diagnosis not present

## 2011-11-28 DIAGNOSIS — Z471 Aftercare following joint replacement surgery: Secondary | ICD-10-CM | POA: Diagnosis not present

## 2011-11-29 DIAGNOSIS — Z96659 Presence of unspecified artificial knee joint: Secondary | ICD-10-CM | POA: Diagnosis not present

## 2011-11-29 DIAGNOSIS — I10 Essential (primary) hypertension: Secondary | ICD-10-CM | POA: Diagnosis not present

## 2011-11-29 DIAGNOSIS — H543 Unqualified visual loss, both eyes: Secondary | ICD-10-CM | POA: Diagnosis not present

## 2011-11-29 DIAGNOSIS — Z471 Aftercare following joint replacement surgery: Secondary | ICD-10-CM | POA: Diagnosis not present

## 2011-11-29 DIAGNOSIS — M069 Rheumatoid arthritis, unspecified: Secondary | ICD-10-CM | POA: Diagnosis not present

## 2011-12-03 DIAGNOSIS — M25669 Stiffness of unspecified knee, not elsewhere classified: Secondary | ICD-10-CM | POA: Diagnosis not present

## 2011-12-03 DIAGNOSIS — R269 Unspecified abnormalities of gait and mobility: Secondary | ICD-10-CM | POA: Diagnosis not present

## 2011-12-03 DIAGNOSIS — Z96659 Presence of unspecified artificial knee joint: Secondary | ICD-10-CM | POA: Diagnosis not present

## 2011-12-03 DIAGNOSIS — M069 Rheumatoid arthritis, unspecified: Secondary | ICD-10-CM | POA: Diagnosis not present

## 2011-12-06 DIAGNOSIS — R269 Unspecified abnormalities of gait and mobility: Secondary | ICD-10-CM | POA: Diagnosis not present

## 2011-12-06 DIAGNOSIS — M069 Rheumatoid arthritis, unspecified: Secondary | ICD-10-CM | POA: Diagnosis not present

## 2011-12-06 DIAGNOSIS — Z96659 Presence of unspecified artificial knee joint: Secondary | ICD-10-CM | POA: Diagnosis not present

## 2011-12-06 DIAGNOSIS — M25669 Stiffness of unspecified knee, not elsewhere classified: Secondary | ICD-10-CM | POA: Diagnosis not present

## 2011-12-11 DIAGNOSIS — Z96659 Presence of unspecified artificial knee joint: Secondary | ICD-10-CM | POA: Diagnosis not present

## 2011-12-11 DIAGNOSIS — M069 Rheumatoid arthritis, unspecified: Secondary | ICD-10-CM | POA: Diagnosis not present

## 2011-12-11 DIAGNOSIS — M25669 Stiffness of unspecified knee, not elsewhere classified: Secondary | ICD-10-CM | POA: Diagnosis not present

## 2011-12-11 DIAGNOSIS — R269 Unspecified abnormalities of gait and mobility: Secondary | ICD-10-CM | POA: Diagnosis not present

## 2011-12-12 DIAGNOSIS — Z79899 Other long term (current) drug therapy: Secondary | ICD-10-CM | POA: Diagnosis not present

## 2011-12-13 DIAGNOSIS — M25669 Stiffness of unspecified knee, not elsewhere classified: Secondary | ICD-10-CM | POA: Diagnosis not present

## 2011-12-13 DIAGNOSIS — R269 Unspecified abnormalities of gait and mobility: Secondary | ICD-10-CM | POA: Diagnosis not present

## 2011-12-13 DIAGNOSIS — M069 Rheumatoid arthritis, unspecified: Secondary | ICD-10-CM | POA: Diagnosis not present

## 2011-12-13 DIAGNOSIS — Z96659 Presence of unspecified artificial knee joint: Secondary | ICD-10-CM | POA: Diagnosis not present

## 2011-12-18 DIAGNOSIS — R269 Unspecified abnormalities of gait and mobility: Secondary | ICD-10-CM | POA: Diagnosis not present

## 2011-12-18 DIAGNOSIS — M069 Rheumatoid arthritis, unspecified: Secondary | ICD-10-CM | POA: Diagnosis not present

## 2011-12-18 DIAGNOSIS — M25669 Stiffness of unspecified knee, not elsewhere classified: Secondary | ICD-10-CM | POA: Diagnosis not present

## 2011-12-18 DIAGNOSIS — Z96659 Presence of unspecified artificial knee joint: Secondary | ICD-10-CM | POA: Diagnosis not present

## 2011-12-20 DIAGNOSIS — R269 Unspecified abnormalities of gait and mobility: Secondary | ICD-10-CM | POA: Diagnosis not present

## 2011-12-20 DIAGNOSIS — M25669 Stiffness of unspecified knee, not elsewhere classified: Secondary | ICD-10-CM | POA: Diagnosis not present

## 2011-12-20 DIAGNOSIS — M069 Rheumatoid arthritis, unspecified: Secondary | ICD-10-CM | POA: Diagnosis not present

## 2011-12-20 DIAGNOSIS — Z96659 Presence of unspecified artificial knee joint: Secondary | ICD-10-CM | POA: Diagnosis not present

## 2011-12-25 DIAGNOSIS — M069 Rheumatoid arthritis, unspecified: Secondary | ICD-10-CM | POA: Diagnosis not present

## 2011-12-25 DIAGNOSIS — R269 Unspecified abnormalities of gait and mobility: Secondary | ICD-10-CM | POA: Diagnosis not present

## 2011-12-25 DIAGNOSIS — Z96659 Presence of unspecified artificial knee joint: Secondary | ICD-10-CM | POA: Diagnosis not present

## 2011-12-25 DIAGNOSIS — M25669 Stiffness of unspecified knee, not elsewhere classified: Secondary | ICD-10-CM | POA: Diagnosis not present

## 2011-12-27 DIAGNOSIS — R269 Unspecified abnormalities of gait and mobility: Secondary | ICD-10-CM | POA: Diagnosis not present

## 2011-12-27 DIAGNOSIS — M069 Rheumatoid arthritis, unspecified: Secondary | ICD-10-CM | POA: Diagnosis not present

## 2011-12-27 DIAGNOSIS — Z96659 Presence of unspecified artificial knee joint: Secondary | ICD-10-CM | POA: Diagnosis not present

## 2011-12-27 DIAGNOSIS — M25669 Stiffness of unspecified knee, not elsewhere classified: Secondary | ICD-10-CM | POA: Diagnosis not present

## 2012-01-01 DIAGNOSIS — Z96659 Presence of unspecified artificial knee joint: Secondary | ICD-10-CM | POA: Diagnosis not present

## 2012-01-01 DIAGNOSIS — M069 Rheumatoid arthritis, unspecified: Secondary | ICD-10-CM | POA: Diagnosis not present

## 2012-01-01 DIAGNOSIS — R269 Unspecified abnormalities of gait and mobility: Secondary | ICD-10-CM | POA: Diagnosis not present

## 2012-01-01 DIAGNOSIS — M25669 Stiffness of unspecified knee, not elsewhere classified: Secondary | ICD-10-CM | POA: Diagnosis not present

## 2012-01-02 DIAGNOSIS — E7800 Pure hypercholesterolemia, unspecified: Secondary | ICD-10-CM | POA: Diagnosis not present

## 2012-01-02 DIAGNOSIS — Z79899 Other long term (current) drug therapy: Secondary | ICD-10-CM | POA: Diagnosis not present

## 2012-01-02 DIAGNOSIS — I10 Essential (primary) hypertension: Secondary | ICD-10-CM | POA: Diagnosis not present

## 2012-01-03 DIAGNOSIS — Z96659 Presence of unspecified artificial knee joint: Secondary | ICD-10-CM | POA: Diagnosis not present

## 2012-01-03 DIAGNOSIS — M25669 Stiffness of unspecified knee, not elsewhere classified: Secondary | ICD-10-CM | POA: Diagnosis not present

## 2012-01-03 DIAGNOSIS — R269 Unspecified abnormalities of gait and mobility: Secondary | ICD-10-CM | POA: Diagnosis not present

## 2012-01-03 DIAGNOSIS — M069 Rheumatoid arthritis, unspecified: Secondary | ICD-10-CM | POA: Diagnosis not present

## 2012-01-08 DIAGNOSIS — M1710 Unilateral primary osteoarthritis, unspecified knee: Secondary | ICD-10-CM | POA: Diagnosis not present

## 2012-01-08 DIAGNOSIS — Z471 Aftercare following joint replacement surgery: Secondary | ICD-10-CM | POA: Diagnosis not present

## 2012-02-13 DIAGNOSIS — M069 Rheumatoid arthritis, unspecified: Secondary | ICD-10-CM | POA: Diagnosis not present

## 2012-02-13 DIAGNOSIS — M25549 Pain in joints of unspecified hand: Secondary | ICD-10-CM | POA: Diagnosis not present

## 2012-02-13 DIAGNOSIS — M25579 Pain in unspecified ankle and joints of unspecified foot: Secondary | ICD-10-CM | POA: Diagnosis not present

## 2012-02-13 DIAGNOSIS — M25569 Pain in unspecified knee: Secondary | ICD-10-CM | POA: Diagnosis not present

## 2012-02-14 DIAGNOSIS — Z6832 Body mass index (BMI) 32.0-32.9, adult: Secondary | ICD-10-CM | POA: Diagnosis not present

## 2012-02-14 DIAGNOSIS — E7800 Pure hypercholesterolemia, unspecified: Secondary | ICD-10-CM | POA: Diagnosis not present

## 2012-02-14 DIAGNOSIS — Z79899 Other long term (current) drug therapy: Secondary | ICD-10-CM | POA: Diagnosis not present

## 2012-03-13 ENCOUNTER — Other Ambulatory Visit: Payer: Self-pay | Admitting: Internal Medicine

## 2012-03-13 DIAGNOSIS — N63 Unspecified lump in unspecified breast: Secondary | ICD-10-CM

## 2012-03-25 ENCOUNTER — Ambulatory Visit
Admission: RE | Admit: 2012-03-25 | Discharge: 2012-03-25 | Disposition: A | Discharge status: Home / Self Care | Payer: Medicare Other | Source: Ambulatory Visit | Attending: Internal Medicine | Admitting: Internal Medicine

## 2012-03-25 ENCOUNTER — Other Ambulatory Visit: Payer: Self-pay | Admitting: Internal Medicine

## 2012-03-25 DIAGNOSIS — N63 Unspecified lump in unspecified breast: Secondary | ICD-10-CM

## 2012-03-25 DIAGNOSIS — N6009 Solitary cyst of unspecified breast: Secondary | ICD-10-CM | POA: Diagnosis not present

## 2012-06-10 DIAGNOSIS — I1310 Hypertensive heart and chronic kidney disease without heart failure, with stage 1 through stage 4 chronic kidney disease, or unspecified chronic kidney disease: Secondary | ICD-10-CM | POA: Diagnosis not present

## 2012-06-10 DIAGNOSIS — E039 Hypothyroidism, unspecified: Secondary | ICD-10-CM | POA: Diagnosis not present

## 2012-06-10 DIAGNOSIS — Z23 Encounter for immunization: Secondary | ICD-10-CM | POA: Diagnosis not present

## 2012-06-10 DIAGNOSIS — E7800 Pure hypercholesterolemia, unspecified: Secondary | ICD-10-CM | POA: Diagnosis not present

## 2012-06-10 DIAGNOSIS — N182 Chronic kidney disease, stage 2 (mild): Secondary | ICD-10-CM | POA: Diagnosis not present

## 2012-06-10 DIAGNOSIS — I129 Hypertensive chronic kidney disease with stage 1 through stage 4 chronic kidney disease, or unspecified chronic kidney disease: Secondary | ICD-10-CM | POA: Diagnosis not present

## 2012-06-10 DIAGNOSIS — E559 Vitamin D deficiency, unspecified: Secondary | ICD-10-CM | POA: Diagnosis not present

## 2012-06-10 DIAGNOSIS — Z0000 Encounter for general adult medical examination without abnormal findings: Secondary | ICD-10-CM | POA: Diagnosis not present

## 2012-06-10 DIAGNOSIS — Z79899 Other long term (current) drug therapy: Secondary | ICD-10-CM | POA: Diagnosis not present

## 2012-07-01 DIAGNOSIS — H40059 Ocular hypertension, unspecified eye: Secondary | ICD-10-CM | POA: Diagnosis not present

## 2012-07-01 DIAGNOSIS — Z79899 Other long term (current) drug therapy: Secondary | ICD-10-CM | POA: Diagnosis not present

## 2012-07-14 DIAGNOSIS — Z79899 Other long term (current) drug therapy: Secondary | ICD-10-CM | POA: Diagnosis not present

## 2012-07-15 DIAGNOSIS — H40019 Open angle with borderline findings, low risk, unspecified eye: Secondary | ICD-10-CM | POA: Diagnosis not present

## 2012-07-15 DIAGNOSIS — H40059 Ocular hypertension, unspecified eye: Secondary | ICD-10-CM | POA: Diagnosis not present

## 2012-07-16 DIAGNOSIS — M25569 Pain in unspecified knee: Secondary | ICD-10-CM | POA: Diagnosis not present

## 2012-07-16 DIAGNOSIS — Z79899 Other long term (current) drug therapy: Secondary | ICD-10-CM | POA: Diagnosis not present

## 2012-07-16 DIAGNOSIS — M25549 Pain in joints of unspecified hand: Secondary | ICD-10-CM | POA: Diagnosis not present

## 2012-07-16 DIAGNOSIS — M25539 Pain in unspecified wrist: Secondary | ICD-10-CM | POA: Diagnosis not present

## 2012-08-20 ENCOUNTER — Other Ambulatory Visit: Payer: Self-pay | Admitting: Internal Medicine

## 2012-08-20 DIAGNOSIS — N63 Unspecified lump in unspecified breast: Secondary | ICD-10-CM

## 2012-09-09 ENCOUNTER — Ambulatory Visit
Admission: RE | Admit: 2012-09-09 | Discharge: 2012-09-09 | Disposition: A | Discharge status: Home / Self Care | Payer: Medicare Other | Source: Ambulatory Visit | Attending: Internal Medicine | Admitting: Internal Medicine

## 2012-09-09 DIAGNOSIS — N63 Unspecified lump in unspecified breast: Secondary | ICD-10-CM

## 2012-09-09 DIAGNOSIS — N6009 Solitary cyst of unspecified breast: Secondary | ICD-10-CM | POA: Diagnosis not present

## 2012-12-08 DIAGNOSIS — N182 Chronic kidney disease, stage 2 (mild): Secondary | ICD-10-CM | POA: Diagnosis not present

## 2012-12-08 DIAGNOSIS — Z79899 Other long term (current) drug therapy: Secondary | ICD-10-CM | POA: Diagnosis not present

## 2012-12-08 DIAGNOSIS — E7800 Pure hypercholesterolemia, unspecified: Secondary | ICD-10-CM | POA: Diagnosis not present

## 2012-12-18 DIAGNOSIS — M069 Rheumatoid arthritis, unspecified: Secondary | ICD-10-CM | POA: Diagnosis not present

## 2013-01-28 DIAGNOSIS — H40059 Ocular hypertension, unspecified eye: Secondary | ICD-10-CM | POA: Diagnosis not present

## 2013-02-11 DIAGNOSIS — E669 Obesity, unspecified: Secondary | ICD-10-CM | POA: Diagnosis not present

## 2013-02-11 DIAGNOSIS — Z6834 Body mass index (BMI) 34.0-34.9, adult: Secondary | ICD-10-CM | POA: Diagnosis not present

## 2013-02-11 DIAGNOSIS — Z79899 Other long term (current) drug therapy: Secondary | ICD-10-CM | POA: Diagnosis not present

## 2013-02-11 DIAGNOSIS — E039 Hypothyroidism, unspecified: Secondary | ICD-10-CM | POA: Diagnosis not present

## 2013-02-16 ENCOUNTER — Other Ambulatory Visit: Payer: Self-pay

## 2013-02-16 DIAGNOSIS — Z1231 Encounter for screening mammogram for malignant neoplasm of breast: Secondary | ICD-10-CM

## 2013-03-18 IMAGING — MG MM DIGITAL DIAG LTD R {BCG}
2 series · 2 of 2 positions shown · non-contrast
Comparison: Prior exams

CLINICAL DATA: Screening callback for questioned right breast mass

DIGITAL DIAGNOSTIC RIGHT MAMMOGRAM WITHOUT CAD AND RIGHT BREAST
ULTRASOUND:

[R CC]
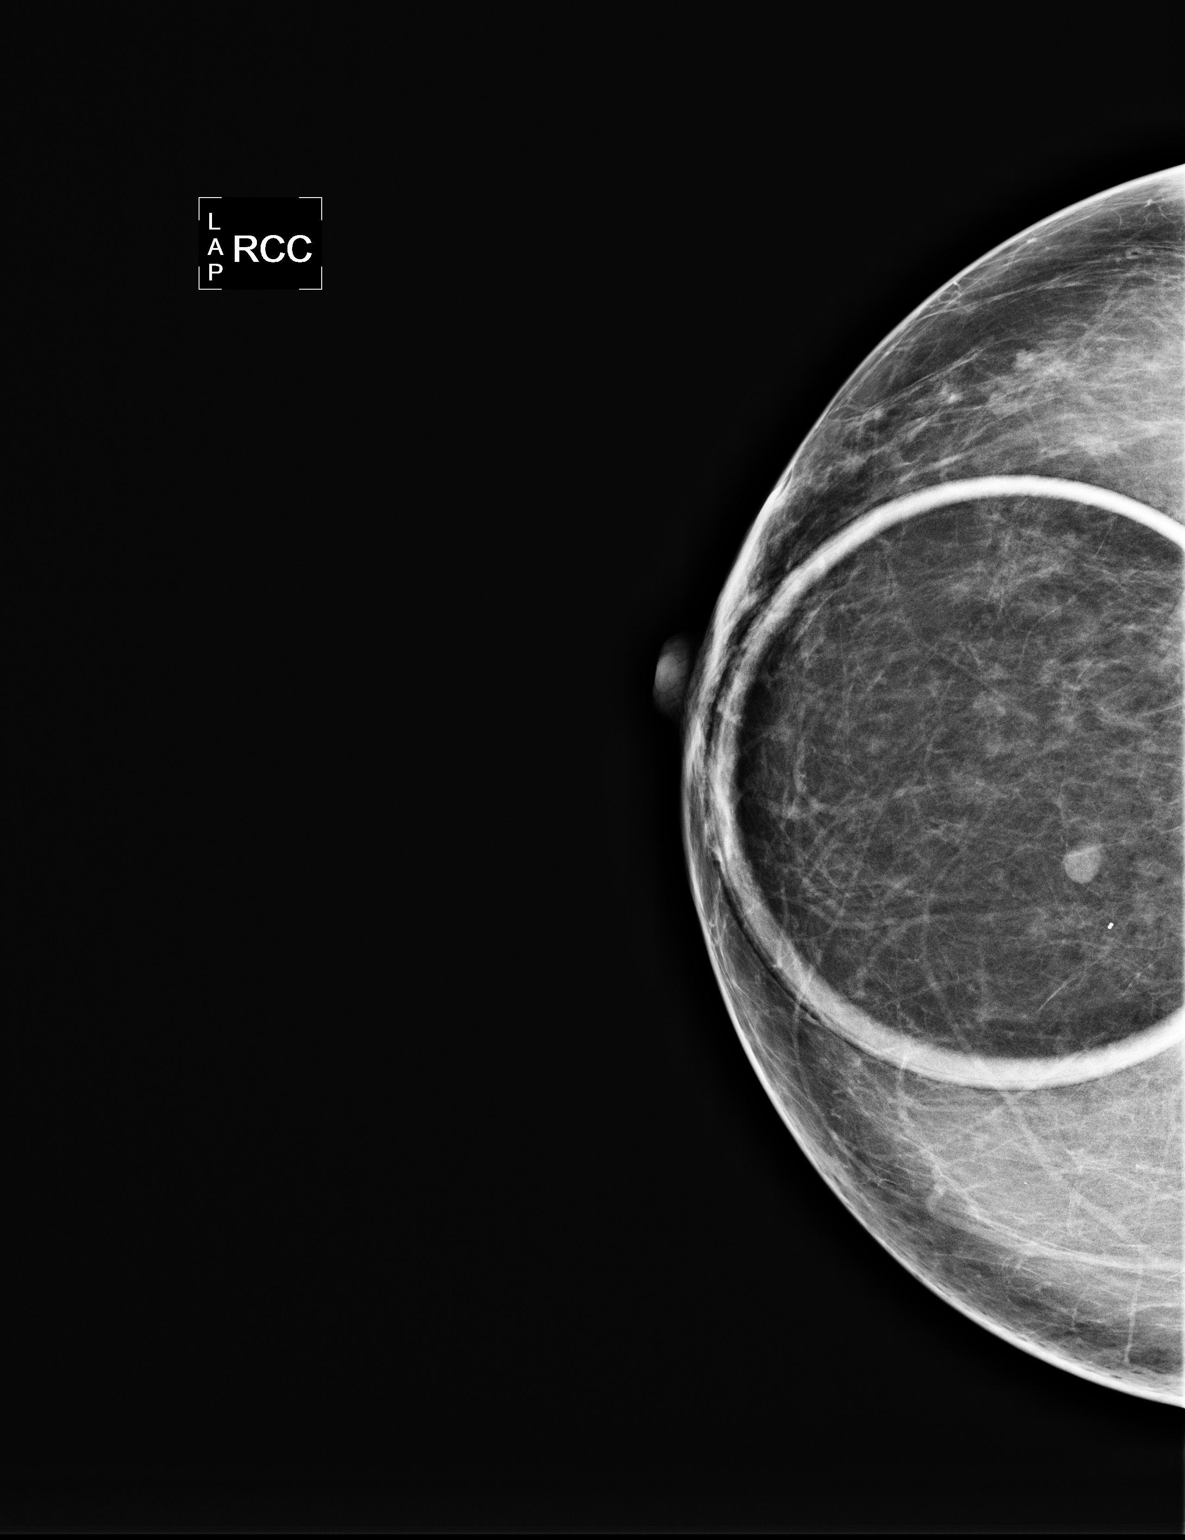

[R MLO]
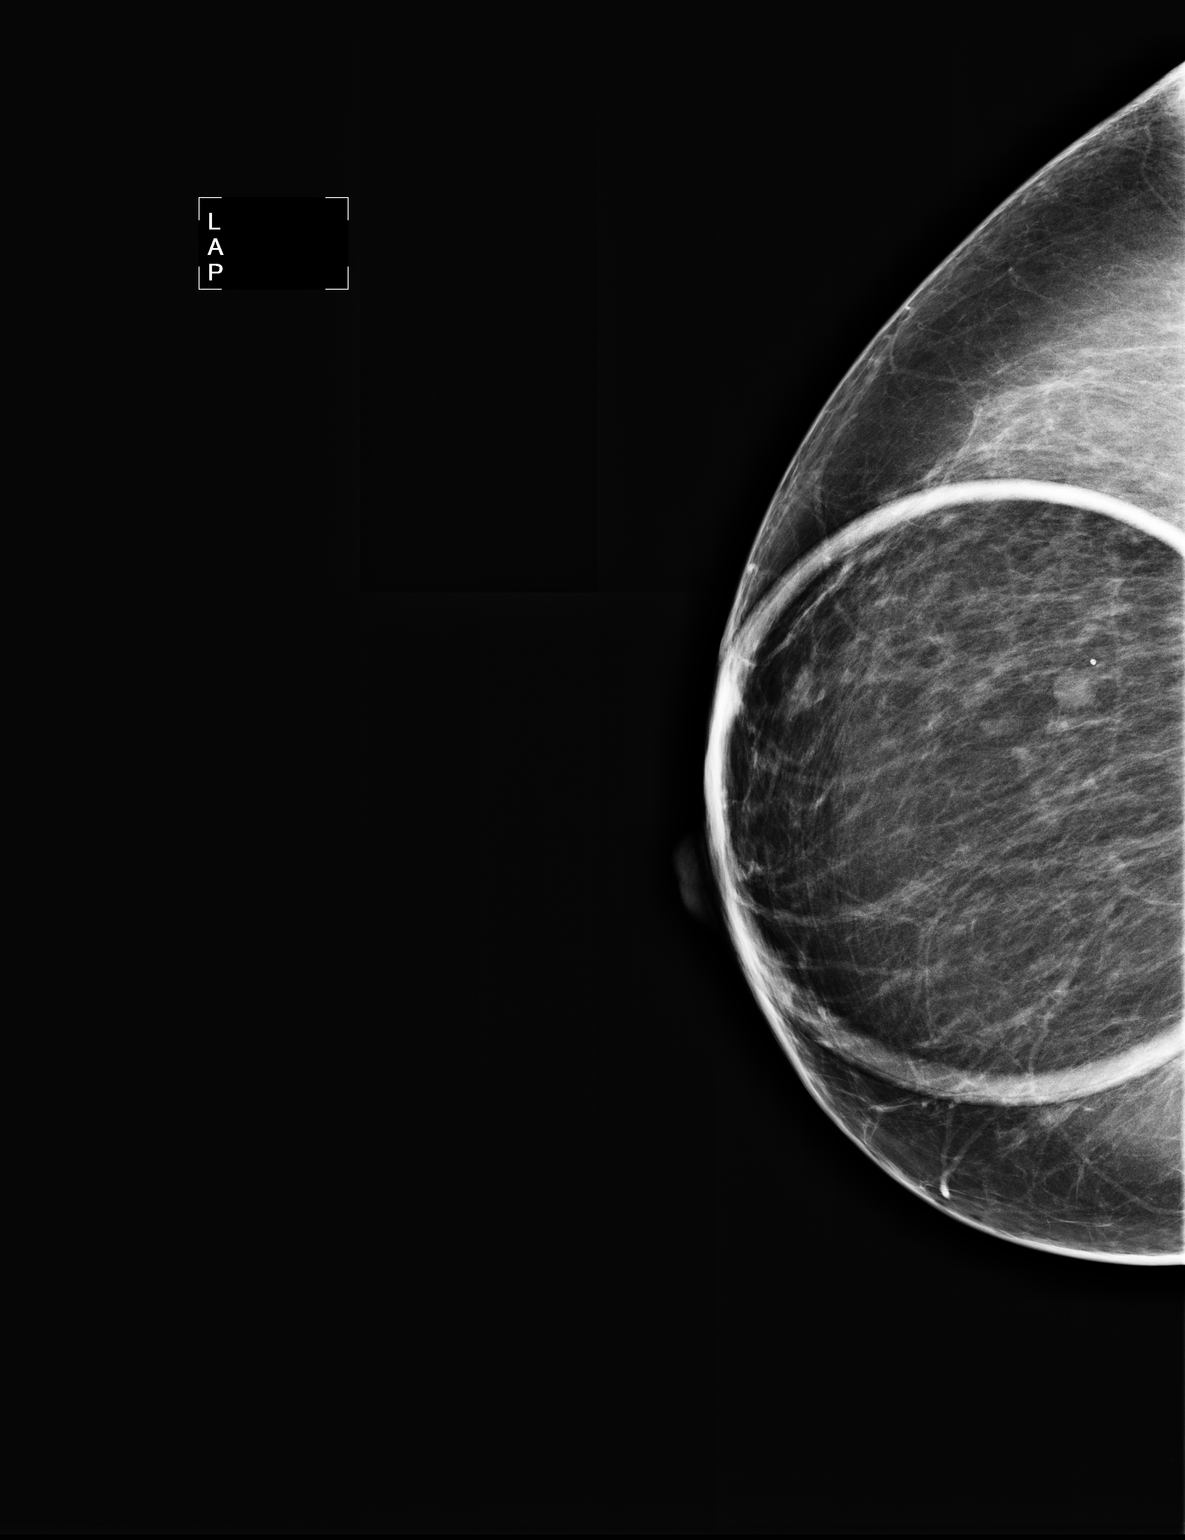

[2 of 2 positions shown; findings below may reference images not displayed]

FINDINGS: In the right breast one o'clock location, there is an
oval circumscribed mass, corresponding to the screening
mammographic finding.

Ultrasound is performed, showing a circumscribed oval hypoechoic
mass in the right breast one o'clock location 5 cm from the nipple
measuring 6 x 4 x 3 mm.  This is felt to correspond to the
mammographic finding.
IMPRESSION: Probable cyst in the right breast one o'clock location with minimal
internal echoes, possibly artifactual related to its small size or
related to debris.  6-month follow-up right diagnostic mammogram
and possibly ultrasound are recommended for further evaluation.
Findings and recommendations discussed with the patient and
provided in written form at the time of the exam.

BI-RADS CATEGORY 3:  Probably benign finding(s) - short interval
follow-up suggested.

## 2013-03-26 ENCOUNTER — Ambulatory Visit
Admission: RE | Admit: 2013-03-26 | Discharge: 2013-03-26 | Disposition: A | Discharge status: Home / Self Care | Payer: Medicare Other | Source: Ambulatory Visit

## 2013-03-26 DIAGNOSIS — Z1231 Encounter for screening mammogram for malignant neoplasm of breast: Secondary | ICD-10-CM

## 2013-05-21 DIAGNOSIS — M5030 Other cervical disc degeneration, unspecified cervical region: Secondary | ICD-10-CM | POA: Diagnosis not present

## 2013-05-21 DIAGNOSIS — Z09 Encounter for follow-up examination after completed treatment for conditions other than malignant neoplasm: Secondary | ICD-10-CM | POA: Diagnosis not present

## 2013-05-21 DIAGNOSIS — M069 Rheumatoid arthritis, unspecified: Secondary | ICD-10-CM | POA: Diagnosis not present

## 2013-05-21 DIAGNOSIS — M1710 Unilateral primary osteoarthritis, unspecified knee: Secondary | ICD-10-CM | POA: Diagnosis not present

## 2013-05-21 DIAGNOSIS — M810 Age-related osteoporosis without current pathological fracture: Secondary | ICD-10-CM | POA: Diagnosis not present

## 2013-06-15 DIAGNOSIS — Z79899 Other long term (current) drug therapy: Secondary | ICD-10-CM | POA: Diagnosis not present

## 2013-06-17 DIAGNOSIS — N182 Chronic kidney disease, stage 2 (mild): Secondary | ICD-10-CM | POA: Diagnosis not present

## 2013-06-17 DIAGNOSIS — E039 Hypothyroidism, unspecified: Secondary | ICD-10-CM | POA: Diagnosis not present

## 2013-06-17 DIAGNOSIS — Z0110 Encounter for examination of ears and hearing without abnormal findings: Secondary | ICD-10-CM | POA: Diagnosis not present

## 2013-06-17 DIAGNOSIS — E7800 Pure hypercholesterolemia, unspecified: Secondary | ICD-10-CM | POA: Diagnosis not present

## 2013-06-17 DIAGNOSIS — Z0100 Encounter for examination of eyes and vision without abnormal findings: Secondary | ICD-10-CM | POA: Diagnosis not present

## 2013-06-17 DIAGNOSIS — I1310 Hypertensive heart and chronic kidney disease without heart failure, with stage 1 through stage 4 chronic kidney disease, or unspecified chronic kidney disease: Secondary | ICD-10-CM | POA: Diagnosis not present

## 2013-06-17 DIAGNOSIS — E559 Vitamin D deficiency, unspecified: Secondary | ICD-10-CM | POA: Diagnosis not present

## 2013-06-17 DIAGNOSIS — R7309 Other abnormal glucose: Secondary | ICD-10-CM | POA: Diagnosis not present

## 2013-06-17 DIAGNOSIS — Z0000 Encounter for general adult medical examination without abnormal findings: Secondary | ICD-10-CM | POA: Diagnosis not present

## 2013-06-25 DIAGNOSIS — M25569 Pain in unspecified knee: Secondary | ICD-10-CM | POA: Diagnosis not present

## 2013-06-25 DIAGNOSIS — Z09 Encounter for follow-up examination after completed treatment for conditions other than malignant neoplasm: Secondary | ICD-10-CM | POA: Diagnosis not present

## 2013-06-25 DIAGNOSIS — M069 Rheumatoid arthritis, unspecified: Secondary | ICD-10-CM | POA: Diagnosis not present

## 2013-06-25 DIAGNOSIS — M1710 Unilateral primary osteoarthritis, unspecified knee: Secondary | ICD-10-CM | POA: Diagnosis not present

## 2013-09-14 DIAGNOSIS — H40019 Open angle with borderline findings, low risk, unspecified eye: Secondary | ICD-10-CM | POA: Diagnosis not present

## 2013-10-09 IMAGING — CR DG CHEST 2V
2 series · 2 of 2 positions shown · non-contrast
Comparison: None.

CLINICAL DATA: Right knee arthroplasty.  Preop.  Ex-smoker.
Hypertension.

CHEST - 2 VIEW

[view not recorded (1 of 2)]
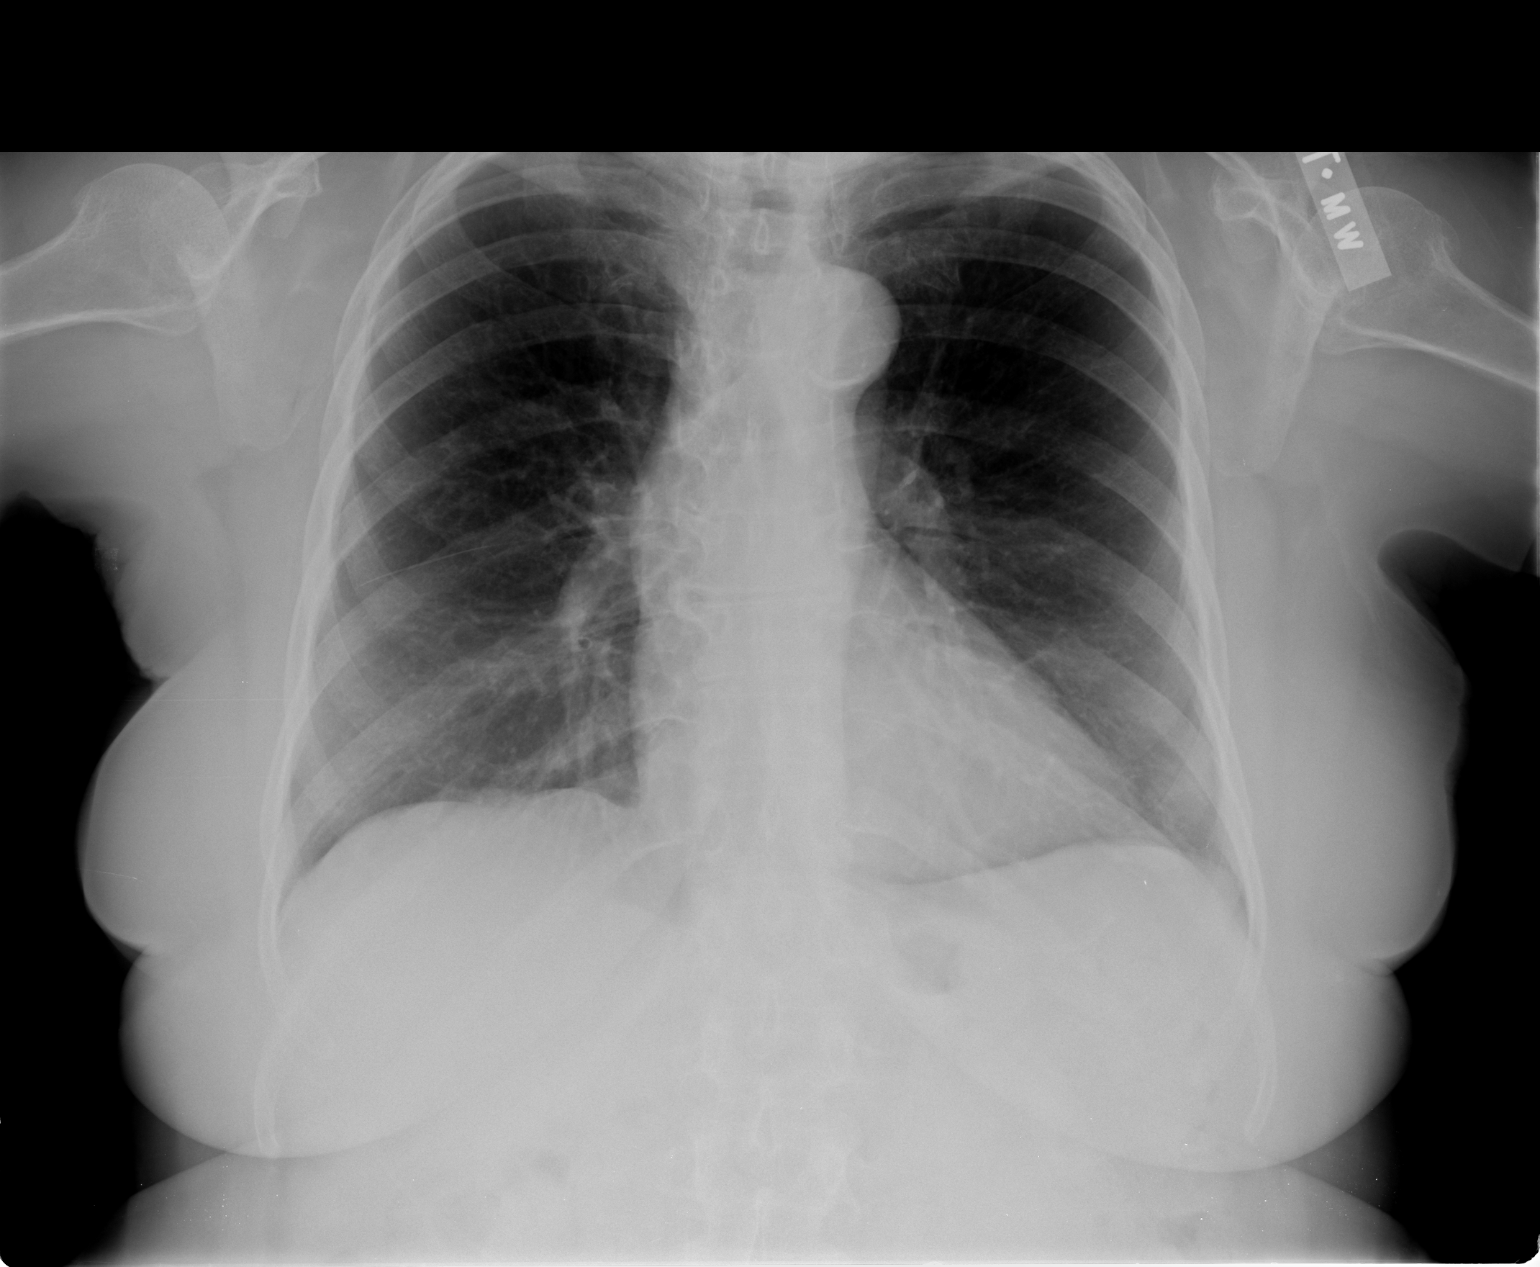

[view not recorded (2 of 2)]
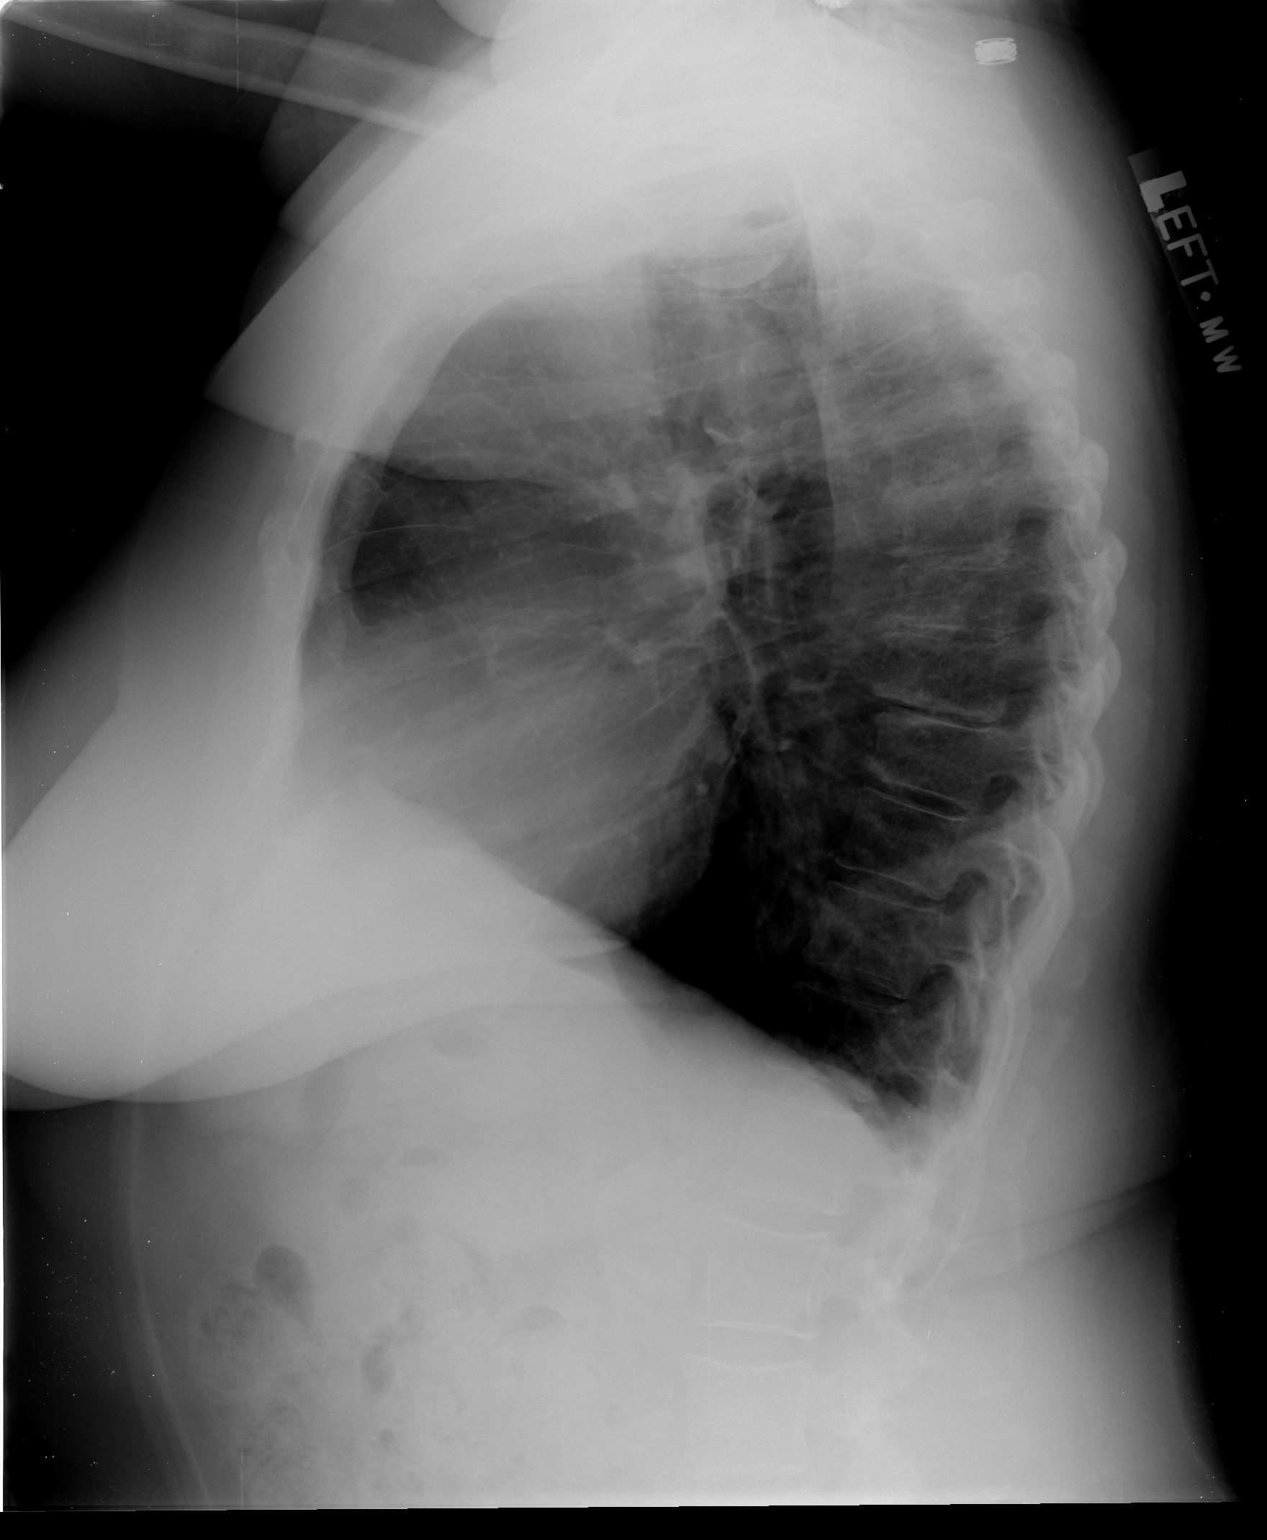

[2 of 2 positions shown; findings below may reference images not displayed]

FINDINGS: Lateral view degraded by patient arm position.

Midline trachea.  Normal heart size and mediastinal contours for
age.  No pleural effusion or pneumothorax.  Clear lungs.
IMPRESSION: No acute cardiopulmonary disease.

## 2013-10-22 DIAGNOSIS — M25569 Pain in unspecified knee: Secondary | ICD-10-CM | POA: Diagnosis not present

## 2013-10-22 DIAGNOSIS — M1710 Unilateral primary osteoarthritis, unspecified knee: Secondary | ICD-10-CM | POA: Diagnosis not present

## 2013-10-22 DIAGNOSIS — M069 Rheumatoid arthritis, unspecified: Secondary | ICD-10-CM | POA: Diagnosis not present

## 2013-10-22 DIAGNOSIS — Z09 Encounter for follow-up examination after completed treatment for conditions other than malignant neoplasm: Secondary | ICD-10-CM | POA: Diagnosis not present

## 2013-12-14 DIAGNOSIS — M1710 Unilateral primary osteoarthritis, unspecified knee: Secondary | ICD-10-CM | POA: Diagnosis not present

## 2013-12-17 DIAGNOSIS — R7309 Other abnormal glucose: Secondary | ICD-10-CM | POA: Diagnosis not present

## 2013-12-17 DIAGNOSIS — R0609 Other forms of dyspnea: Secondary | ICD-10-CM | POA: Diagnosis not present

## 2013-12-17 DIAGNOSIS — I517 Cardiomegaly: Secondary | ICD-10-CM | POA: Diagnosis not present

## 2013-12-17 DIAGNOSIS — I1310 Hypertensive heart and chronic kidney disease without heart failure, with stage 1 through stage 4 chronic kidney disease, or unspecified chronic kidney disease: Secondary | ICD-10-CM | POA: Diagnosis not present

## 2013-12-17 DIAGNOSIS — N182 Chronic kidney disease, stage 2 (mild): Secondary | ICD-10-CM | POA: Diagnosis not present

## 2013-12-17 DIAGNOSIS — R0989 Other specified symptoms and signs involving the circulatory and respiratory systems: Secondary | ICD-10-CM | POA: Diagnosis not present

## 2013-12-17 DIAGNOSIS — E7800 Pure hypercholesterolemia, unspecified: Secondary | ICD-10-CM | POA: Diagnosis not present

## 2013-12-17 DIAGNOSIS — J069 Acute upper respiratory infection, unspecified: Secondary | ICD-10-CM | POA: Diagnosis not present

## 2013-12-21 DIAGNOSIS — M1710 Unilateral primary osteoarthritis, unspecified knee: Secondary | ICD-10-CM | POA: Diagnosis not present

## 2013-12-28 DIAGNOSIS — M1710 Unilateral primary osteoarthritis, unspecified knee: Secondary | ICD-10-CM | POA: Diagnosis not present

## 2014-01-04 DIAGNOSIS — M1710 Unilateral primary osteoarthritis, unspecified knee: Secondary | ICD-10-CM | POA: Diagnosis not present

## 2014-01-11 DIAGNOSIS — M1710 Unilateral primary osteoarthritis, unspecified knee: Secondary | ICD-10-CM | POA: Diagnosis not present

## 2014-03-01 DIAGNOSIS — M1710 Unilateral primary osteoarthritis, unspecified knee: Secondary | ICD-10-CM | POA: Diagnosis not present

## 2014-04-05 ENCOUNTER — Other Ambulatory Visit: Payer: Self-pay

## 2014-04-05 DIAGNOSIS — Z1231 Encounter for screening mammogram for malignant neoplasm of breast: Secondary | ICD-10-CM

## 2014-04-12 ENCOUNTER — Ambulatory Visit
Admission: RE | Admit: 2014-04-12 | Discharge: 2014-04-12 | Disposition: A | Discharge status: Home / Self Care | Payer: Medicare Other | Source: Ambulatory Visit

## 2014-04-12 DIAGNOSIS — Z1231 Encounter for screening mammogram for malignant neoplasm of breast: Secondary | ICD-10-CM | POA: Diagnosis not present

## 2014-04-13 DIAGNOSIS — M069 Rheumatoid arthritis, unspecified: Secondary | ICD-10-CM | POA: Diagnosis not present

## 2014-04-13 DIAGNOSIS — M1710 Unilateral primary osteoarthritis, unspecified knee: Secondary | ICD-10-CM | POA: Diagnosis not present

## 2014-04-13 DIAGNOSIS — M5030 Other cervical disc degeneration, unspecified cervical region: Secondary | ICD-10-CM | POA: Diagnosis not present

## 2014-04-13 DIAGNOSIS — Z79899 Other long term (current) drug therapy: Secondary | ICD-10-CM | POA: Diagnosis not present

## 2014-04-13 DIAGNOSIS — Z09 Encounter for follow-up examination after completed treatment for conditions other than malignant neoplasm: Secondary | ICD-10-CM | POA: Diagnosis not present

## 2014-06-21 DIAGNOSIS — R6889 Other general symptoms and signs: Secondary | ICD-10-CM | POA: Diagnosis not present

## 2014-09-03 DIAGNOSIS — R7309 Other abnormal glucose: Secondary | ICD-10-CM | POA: Diagnosis not present

## 2014-09-03 DIAGNOSIS — E039 Hypothyroidism, unspecified: Secondary | ICD-10-CM | POA: Diagnosis not present

## 2014-09-03 DIAGNOSIS — I129 Hypertensive chronic kidney disease with stage 1 through stage 4 chronic kidney disease, or unspecified chronic kidney disease: Secondary | ICD-10-CM | POA: Diagnosis not present

## 2014-09-03 DIAGNOSIS — N182 Chronic kidney disease, stage 2 (mild): Secondary | ICD-10-CM | POA: Diagnosis not present

## 2014-10-05 DIAGNOSIS — E559 Vitamin D deficiency, unspecified: Secondary | ICD-10-CM | POA: Diagnosis not present

## 2014-10-05 DIAGNOSIS — M170 Bilateral primary osteoarthritis of knee: Secondary | ICD-10-CM | POA: Diagnosis not present

## 2014-10-05 DIAGNOSIS — Z79899 Other long term (current) drug therapy: Secondary | ICD-10-CM | POA: Diagnosis not present

## 2014-10-05 DIAGNOSIS — M069 Rheumatoid arthritis, unspecified: Secondary | ICD-10-CM | POA: Diagnosis not present

## 2014-11-12 DIAGNOSIS — M25562 Pain in left knee: Secondary | ICD-10-CM | POA: Diagnosis not present

## 2014-11-18 DIAGNOSIS — Z79899 Other long term (current) drug therapy: Secondary | ICD-10-CM | POA: Diagnosis not present

## 2014-11-18 DIAGNOSIS — H40013 Open angle with borderline findings, low risk, bilateral: Secondary | ICD-10-CM | POA: Diagnosis not present

## 2014-12-08 DIAGNOSIS — D2370 Other benign neoplasm of skin of unspecified lower limb, including hip: Secondary | ICD-10-CM | POA: Diagnosis not present

## 2014-12-09 DIAGNOSIS — H40013 Open angle with borderline findings, low risk, bilateral: Secondary | ICD-10-CM | POA: Diagnosis not present

## 2014-12-21 DIAGNOSIS — E099 Drug or chemical induced diabetes mellitus without complications: Secondary | ICD-10-CM | POA: Diagnosis not present

## 2014-12-21 DIAGNOSIS — E559 Vitamin D deficiency, unspecified: Secondary | ICD-10-CM | POA: Diagnosis not present

## 2014-12-21 DIAGNOSIS — E780 Pure hypercholesterolemia: Secondary | ICD-10-CM | POA: Diagnosis not present

## 2014-12-21 DIAGNOSIS — E039 Hypothyroidism, unspecified: Secondary | ICD-10-CM | POA: Diagnosis not present

## 2014-12-21 DIAGNOSIS — I129 Hypertensive chronic kidney disease with stage 1 through stage 4 chronic kidney disease, or unspecified chronic kidney disease: Secondary | ICD-10-CM | POA: Diagnosis not present

## 2014-12-21 DIAGNOSIS — Z0000 Encounter for general adult medical examination without abnormal findings: Secondary | ICD-10-CM | POA: Diagnosis not present

## 2014-12-21 DIAGNOSIS — N182 Chronic kidney disease, stage 2 (mild): Secondary | ICD-10-CM | POA: Diagnosis not present

## 2014-12-22 DIAGNOSIS — M25562 Pain in left knee: Secondary | ICD-10-CM | POA: Diagnosis not present

## 2014-12-22 DIAGNOSIS — R262 Difficulty in walking, not elsewhere classified: Secondary | ICD-10-CM | POA: Diagnosis not present

## 2014-12-28 ENCOUNTER — Other Ambulatory Visit: Payer: Self-pay | Admitting: Physician Assistant

## 2014-12-28 DIAGNOSIS — M25562 Pain in left knee: Secondary | ICD-10-CM | POA: Diagnosis not present

## 2014-12-28 NOTE — H&P (Signed)
TOTAL KNEE ADMISSION H&P  Patient is being admitted for left total knee arthroplasty.  Subjective:  Chief Complaint:left knee pain.  HPI: Hannah Castillo, 74 y.o. female, has a history of pain and functional disability in the left knee due to arthritis and has failed non-surgical conservative treatments for greater than 12 weeks to includeNSAID's and/or analgesics, corticosteriod injections and viscosupplementation injections.  Onset of symptoms was gradual, starting 3 years ago with rapidlly worsening course since that time. The patient noted no past surgery on the left knee(s).  Patient currently rates pain in the left knee(s) at 5 out of 10 with activity. Patient has night pain, worsening of pain with activity and weight bearing and pain that interferes with activities of daily living.  Patient has evidence of subchondral sclerosis and joint space narrowing by imaging studies. There is no active infection.  There are no active problems to display for this patient.  Past Medical History  Diagnosis Date  . Hypothyroidism   . Rheumatoid arthritis   . Hypertension     Seen Dr. Nadyne Coombes for clearnence  . PONV (postoperative nausea and vomiting)   . Degenerative joint disease     Past Surgical History  Procedure Laterality Date  . Thyroidectomy, partial    . Appendectomy    . Abdominal hysterectomy    . Lazer eye surgery    . Cataract extraction    . Total knee arthroplasty  10/17/2011    Procedure: TOTAL KNEE ARTHROPLASTY;  Surgeon: Ninetta Lights, MD;  Location: [place];  Service: Orthopedics;  Laterality: Right;  120 MINUTES FOR THIS SURGERY     (Not in a hospital admission) Allergies  Allergen Reactions  . Hydrocodone Nausea Only    MAKES PT FEEL VERY "SICK"    History  Substance Use Topics  . Smoking status: Former Smoker -- 0.50 packs/day for 40 years    Quit date: 10/01/2001  . Smokeless tobacco: Never Used  . Alcohol Use: No    Family History  Problem Relation Age of  Onset  . Anesthesia problems Neg Hx      Review of Systems  Constitutional: Negative.   HENT: Negative.   Eyes: Negative.   Respiratory: Negative.   Cardiovascular: Negative.   Gastrointestinal: Negative.   Genitourinary: Negative.   Musculoskeletal: Positive for joint pain.  Skin: Negative.   Neurological: Negative.   Endo/Heme/Allergies: Negative for environmental allergies. Bruises/bleeds easily.  Psychiatric/Behavioral: Negative.     Objective:  Physical Exam  Constitutional: She is oriented to person, place, and time. She appears well-developed and well-nourished. No distress.  HENT:  Head: Normocephalic and atraumatic.  Eyes: EOM are normal. Pupils are equal, round, and reactive to light.  Neck: Normal range of motion. Neck supple.  Cardiovascular: Normal rate and regular rhythm.   Respiratory: Effort normal and breath sounds normal. No respiratory distress. She has no wheezes. She has no rales.  GI: Soft. Bowel sounds are normal. She exhibits distension. There is no tenderness.  Musculoskeletal:  Antalgic gait with valgus thrust on the left.  Negative log roll of both hips.  Negative straight leg raise.  Neurovascularly intact.  Right knee good alignment.  Good stability.  Motion 0-120.  Left knee unstable into valgus, more than 10 degrees, partially correctable.  Tibiofemoral and patellofemoral crepitus.  Still full extension, slight extensor lag.  Flexion 110 degrees.    Neurological: She is alert and oriented to person, place, and time.  Skin: Skin is warm and dry.  Psychiatric:  She has a normal mood and affect. Her behavior is normal. Judgment and thought content normal.    Vital signs in last 24 hours: @VSRANGES @  Labs:   Estimated body mass index is 30.70 kg/(m^2) as calculated from the following:   Height as of 10/17/11: 5\' 5"  (1.651 m).   Weight as of 10/12/11: 83.689 kg (184 lb 8 oz).   Imaging Review Plain radiographs demonstrate severe degenerative  joint disease of the left knee(s). The overall alignment ismild valgus. The bone quality appears to be fair for age and reported activity level.  Assessment/Plan:  End stage arthritis, left knee   The patient history, physical examination, clinical judgment of the provider and imaging studies are consistent with end stage degenerative joint disease of the left knee(s) and total knee arthroplasty is deemed medically necessary. The treatment options including medical management, injection therapy arthroscopy and arthroplasty were discussed at length. The risks and benefits of total knee arthroplasty were presented and reviewed. The risks due to aseptic loosening, infection, stiffness, patella tracking problems, thromboembolic complications and other imponderables were discussed. The patient acknowledged the explanation, agreed to proceed with the plan and consent was signed. Patient is being admitted for inpatient treatment for surgery, pain control, PT, OT, prophylactic antibiotics, VTE prophylaxis, progressive ambulation and ADL's and discharge planning. The patient is planning to be discharged to skilled nursing facility

## 2014-12-30 ENCOUNTER — Encounter (HOSPITAL_COMMUNITY)
Admission: RE | Admit: 2014-12-30 | Discharge: 2014-12-30 | Disposition: A | Discharge status: Home / Self Care | Payer: Medicare Other | Source: Ambulatory Visit | Attending: Orthopedic Surgery | Admitting: Orthopedic Surgery

## 2014-12-30 ENCOUNTER — Encounter (HOSPITAL_COMMUNITY): Payer: Self-pay

## 2014-12-30 HISTORY — DX: Hyperlipidemia, unspecified: E78.5

## 2014-12-30 HISTORY — DX: Personal history of other diseases of the respiratory system: Z87.09

## 2014-12-30 HISTORY — DX: Pain in unspecified joint: M25.50

## 2014-12-30 HISTORY — DX: Type 2 diabetes mellitus without complications: E11.9

## 2014-12-30 LAB — CBC WITH DIFFERENTIAL/PLATELET
Basophils Absolute: 0 10*3/uL (ref 0.0–0.1)
Basophils Relative: 1 % (ref 0–1)
Eosinophils Absolute: 0.1 10*3/uL (ref 0.0–0.7)
Eosinophils Relative: 2 % (ref 0–5)
HCT: 39.3 % (ref 36.0–46.0)
Hemoglobin: 13 g/dL (ref 12.0–15.0)
LYMPHS ABS: 2.7 10*3/uL (ref 0.7–4.0)
LYMPHS PCT: 44 % (ref 12–46)
MCH: 29.8 pg (ref 26.0–34.0)
MCHC: 33.1 g/dL (ref 30.0–36.0)
MCV: 90.1 fL (ref 78.0–100.0)
MONOS PCT: 7 % (ref 3–12)
Monocytes Absolute: 0.5 10*3/uL (ref 0.1–1.0)
NEUTROS ABS: 2.9 10*3/uL (ref 1.7–7.7)
NEUTROS PCT: 46 % (ref 43–77)
PLATELETS: 278 10*3/uL (ref 150–400)
RBC: 4.36 MIL/uL (ref 3.87–5.11)
RDW: 13.1 % (ref 11.5–15.5)
WBC: 6.2 10*3/uL (ref 4.0–10.5)

## 2014-12-30 LAB — COMPREHENSIVE METABOLIC PANEL
ALT: 24 U/L (ref 0–35)
AST: 26 U/L (ref 0–37)
Albumin: 3.9 g/dL (ref 3.5–5.2)
Alkaline Phosphatase: 143 U/L — ABNORMAL HIGH (ref 39–117)
Anion gap: 7 (ref 5–15)
BUN: 14 mg/dL (ref 6–23)
CO2: 30 mmol/L (ref 19–32)
CREATININE: 1.04 mg/dL (ref 0.50–1.10)
Calcium: 10.2 mg/dL (ref 8.4–10.5)
Chloride: 103 mmol/L (ref 96–112)
GFR, EST AFRICAN AMERICAN: 59 mL/min — AB (ref >90)
GFR, EST NON AFRICAN AMERICAN: 51 mL/min — AB (ref >90)
GLUCOSE: 85 mg/dL (ref 70–99)
Potassium: 3.7 mmol/L (ref 3.5–5.1)
Sodium: 140 mmol/L (ref 135–145)
Total Bilirubin: 0.8 mg/dL (ref 0.3–1.2)
Total Protein: 7 g/dL (ref 6.0–8.3)

## 2014-12-30 LAB — URINALYSIS, ROUTINE W REFLEX MICROSCOPIC
Bilirubin Urine: NEGATIVE
Glucose, UA: NEGATIVE mg/dL
HGB URINE DIPSTICK: NEGATIVE
KETONES UR: NEGATIVE mg/dL
Leukocytes, UA: NEGATIVE
NITRITE: NEGATIVE
PH: 7 (ref 5.0–8.0)
Protein, ur: NEGATIVE mg/dL
Specific Gravity, Urine: 1.011 (ref 1.005–1.030)
Urobilinogen, UA: 0.2 mg/dL (ref 0.0–1.0)

## 2014-12-30 LAB — SURGICAL PCR SCREEN
MRSA, PCR: NEGATIVE
Staphylococcus aureus: NEGATIVE

## 2014-12-30 LAB — PROTIME-INR
INR: 1.02 (ref 0.00–1.49)
Prothrombin Time: 13.5 seconds (ref 11.6–15.2)

## 2014-12-30 LAB — TYPE AND SCREEN
ABO/RH(D): O POS
Antibody Screen: NEGATIVE

## 2014-12-30 LAB — APTT: aPTT: 37 seconds (ref 24–37)

## 2014-12-30 MED ORDER — CHLORHEXIDINE GLUCONATE 4 % EX LIQD: 60.0000 mL | Freq: Once | CUTANEOUS | Status: DC

## 2014-12-30 NOTE — Progress Notes (Addendum)
Pt doesn't have a cardiologist  Saw Dr.Ganji prior to knee replacement in 2013 but medical md told her she doesn't need cardiac clearance this time  Stress test done in 2013-under media tab in epic along with visit to [place] is Dr.Robyn Sanders  EKG to be requested from Dr.Sanders  Denies CXR in past yr  Denies ever having a heart cath

## 2014-12-30 NOTE — Pre-Procedure Instructions (Signed)
Hannah Castillo  12/30/2014   Your procedure is scheduled on:  Wed, April 13 @ 1:55 PM  Report to [facility]  at 11:45 AM.  Call this number if you have problems the morning of surgery: [phone]   Remember:   Do not eat food or drink liquids after midnight.   Take these medicines the morning of surgery with A SIP OF WATER: Synthroid(Levothyroxine)              Stop taking your Aspirin and Eliquis as you have been instructed along with your Fish Oil. No Goody's,BC's,Aleve,Ibuprofen,or Herbal Medications.    Do not wear jewelry, make-up or nail polish.  Do not wear lotions, powders, or perfumes. You may wear deodorant.  Do not shave 48 hours prior to surgery.  Do not bring valuables to the hospital.  [facility] is not responsible                  for any belongings or valuables.               Contacts, dentures or bridgework may not be worn into surgery.  Leave suitcase in the car. After surgery it may be brought to your room.  For patients admitted to the hospital, discharge time is determined by your                treatment team.                 Special Instructions:  [place] - Preparing for Surgery  Before surgery, you can play an important role.  Because skin is not sterile, your skin needs to be as free of germs as possible.  You can reduce the number of germs on you skin by washing with CHG (chlorahexidine gluconate) soap before surgery.  CHG is an antiseptic cleaner which kills germs and bonds with the skin to continue killing germs even after washing.  Please DO NOT use if you have an allergy to CHG or antibacterial soaps.  If your skin becomes reddened/irritated stop using the CHG and inform your nurse when you arrive at Short Stay.  Do not shave (including legs and underarms) for at least 48 hours prior to the first CHG shower.  You may shave your face.  Please follow these instructions carefully:   1.  Shower with CHG Soap the night before surgery  and the                                morning of Surgery.  2.  If you choose to wash your hair, wash your hair first as usual with your       normal shampoo.  3.  After you shampoo, rinse your hair and body thoroughly to remove the                      Shampoo.  4.  Use CHG as you would any other liquid soap.  You can apply chg directly       to the skin and wash gently with scrungie or a clean washcloth.  5.  Apply the CHG Soap to your body ONLY FROM THE NECK DOWN.        Do not use on open wounds or open sores.  Avoid contact with your eyes,       ears, mouth and genitals (private parts).  Wash genitals (  private parts)       with your normal soap.  6.  Wash thoroughly, paying special attention to the area where your surgery        will be performed.  7.  Thoroughly rinse your body with warm water from the neck down.  8.  DO NOT shower/wash with your normal soap after using and rinsing off       the CHG Soap.  9.  Pat yourself dry with a clean towel.            10.  Wear clean pajamas.            11.  Place clean sheets on your bed the night of your first shower and do not        sleep with pets.  Day of Surgery  Do not apply any lotions/deoderants the morning of surgery.  Please wear clean clothes to the hospital/surgery center.     Please read over the following fact sheets that you were given: Pain Booklet, Coughing and Deep Breathing, Blood Transfusion Information, MRSA Information and Surgical Site Infection Prevention

## 2014-12-30 NOTE — Progress Notes (Signed)
Saw Dr.Ganji in 2013-report under media tab in epic

## 2015-01-01 LAB — URINE CULTURE
Colony Count: NO GROWTH
Culture: NO GROWTH

## 2015-01-11 MED ORDER — LACTATED RINGERS IV SOLN
INTRAVENOUS | Status: DC
  Administered 2015-01-12 (×3): via INTRAVENOUS

## 2015-01-11 MED ORDER — CEFAZOLIN SODIUM-DEXTROSE 2-3 GM-% IV SOLR
2.0000 g | INTRAVENOUS | Status: AC
  Administered 2015-01-12: 2 g via INTRAVENOUS
  Filled 2015-01-11: qty 50

## 2015-01-12 ENCOUNTER — Inpatient Hospital Stay (HOSPITAL_COMMUNITY): Payer: Medicare Other | Admitting: Anesthesiology

## 2015-01-12 ENCOUNTER — Encounter (HOSPITAL_COMMUNITY)
Admission: RE | Disposition: A | Discharge status: Skilled Nursing Facility | Payer: Self-pay | Source: Ambulatory Visit | Attending: Orthopedic Surgery

## 2015-01-12 ENCOUNTER — Inpatient Hospital Stay (HOSPITAL_COMMUNITY)
Admission: RE | Admit: 2015-01-12 | Discharge: 2015-01-14 | DRG: 470 | Disposition: A | Discharge status: Skilled Nursing Facility | Payer: Medicare Other | Source: Ambulatory Visit | Attending: Orthopedic Surgery | Admitting: Orthopedic Surgery

## 2015-01-12 ENCOUNTER — Inpatient Hospital Stay (HOSPITAL_COMMUNITY): Payer: Medicare Other

## 2015-01-12 ENCOUNTER — Encounter (HOSPITAL_COMMUNITY): Payer: Self-pay | Admitting: *Deleted

## 2015-01-12 DIAGNOSIS — Z7901 Long term (current) use of anticoagulants: Secondary | ICD-10-CM

## 2015-01-12 DIAGNOSIS — I10 Essential (primary) hypertension: Secondary | ICD-10-CM | POA: Diagnosis present

## 2015-01-12 DIAGNOSIS — D62 Acute posthemorrhagic anemia: Secondary | ICD-10-CM | POA: Diagnosis not present

## 2015-01-12 DIAGNOSIS — E119 Type 2 diabetes mellitus without complications: Secondary | ICD-10-CM | POA: Diagnosis present

## 2015-01-12 DIAGNOSIS — E039 Hypothyroidism, unspecified: Secondary | ICD-10-CM | POA: Diagnosis present

## 2015-01-12 DIAGNOSIS — E785 Hyperlipidemia, unspecified: Secondary | ICD-10-CM | POA: Diagnosis present

## 2015-01-12 DIAGNOSIS — M179 Osteoarthritis of knee, unspecified: Secondary | ICD-10-CM | POA: Diagnosis not present

## 2015-01-12 DIAGNOSIS — Z96651 Presence of right artificial knee joint: Secondary | ICD-10-CM | POA: Diagnosis present

## 2015-01-12 DIAGNOSIS — Z79899 Other long term (current) drug therapy: Secondary | ICD-10-CM | POA: Diagnosis not present

## 2015-01-12 DIAGNOSIS — Z96652 Presence of left artificial knee joint: Secondary | ICD-10-CM | POA: Diagnosis not present

## 2015-01-12 DIAGNOSIS — R2689 Other abnormalities of gait and mobility: Secondary | ICD-10-CM | POA: Diagnosis not present

## 2015-01-12 DIAGNOSIS — M1712 Unilateral primary osteoarthritis, left knee: Principal | ICD-10-CM | POA: Diagnosis present

## 2015-01-12 DIAGNOSIS — Z471 Aftercare following joint replacement surgery: Secondary | ICD-10-CM | POA: Diagnosis not present

## 2015-01-12 DIAGNOSIS — Z87891 Personal history of nicotine dependence: Secondary | ICD-10-CM

## 2015-01-12 DIAGNOSIS — M069 Rheumatoid arthritis, unspecified: Secondary | ICD-10-CM | POA: Diagnosis present

## 2015-01-12 DIAGNOSIS — Z96659 Presence of unspecified artificial knee joint: Secondary | ICD-10-CM

## 2015-01-12 DIAGNOSIS — M25562 Pain in left knee: Secondary | ICD-10-CM | POA: Diagnosis not present

## 2015-01-12 DIAGNOSIS — R278 Other lack of coordination: Secondary | ICD-10-CM | POA: Diagnosis not present

## 2015-01-12 DIAGNOSIS — M1710 Unilateral primary osteoarthritis, unspecified knee: Secondary | ICD-10-CM | POA: Diagnosis present

## 2015-01-12 HISTORY — PX: TOTAL KNEE ARTHROPLASTY: SHX125

## 2015-01-12 LAB — GLUCOSE, CAPILLARY
Glucose-Capillary: 82 mg/dL (ref 70–99)
Glucose-Capillary: 104 mg/dL — ABNORMAL HIGH (ref 70–99)

## 2015-01-12 SURGERY — ARTHROPLASTY, KNEE, TOTAL
Anesthesia: General | Laterality: Left

## 2015-01-12 MED ORDER — FENTANYL CITRATE 0.05 MG/ML IJ SOLN
INTRAMUSCULAR | Status: AC
  Filled 2015-01-12: qty 5

## 2015-01-12 MED ORDER — ONDANSETRON HCL 4 MG/2ML IJ SOLN
INTRAMUSCULAR | Status: AC
  Filled 2015-01-12: qty 2

## 2015-01-12 MED ORDER — CEFAZOLIN SODIUM-DEXTROSE 2-3 GM-% IV SOLR
2.0000 g | Freq: Four times a day (QID) | INTRAVENOUS | Status: AC
  Administered 2015-01-12 – 2015-01-13 (×2): 2 g via INTRAVENOUS
  Filled 2015-01-12 (×2): qty 50

## 2015-01-12 MED ORDER — ONDANSETRON HCL 4 MG/2ML IJ SOLN: 4.0000 mg | Freq: Four times a day (QID) | INTRAMUSCULAR | Status: DC | PRN

## 2015-01-12 MED ORDER — ALBUMIN HUMAN 5 % IV SOLN
INTRAVENOUS | Status: AC
  Filled 2015-01-12: qty 250

## 2015-01-12 MED ORDER — ROCURONIUM BROMIDE 100 MG/10ML IV SOLN
INTRAVENOUS | Status: DC | PRN
  Administered 2015-01-12: 40 mg via INTRAVENOUS

## 2015-01-12 MED ORDER — METOCLOPRAMIDE HCL 5 MG/ML IJ SOLN: 5.0000 mg | Freq: Three times a day (TID) | INTRAMUSCULAR | Status: DC | PRN

## 2015-01-12 MED ORDER — FENTANYL CITRATE 0.05 MG/ML IJ SOLN
25.0000 ug | INTRAMUSCULAR | Status: DC | PRN
  Administered 2015-01-12 (×3): 50 ug via INTRAVENOUS

## 2015-01-12 MED ORDER — POTASSIUM CHLORIDE IN NACL 20-0.9 MEQ/L-% IV SOLN
INTRAVENOUS | Status: DC
  Administered 2015-01-13: 05:00:00 via INTRAVENOUS
  Filled 2015-01-12 (×4): qty 1000

## 2015-01-12 MED ORDER — DIPHENHYDRAMINE HCL 12.5 MG/5ML PO ELIX: 12.5000 mg | ORAL_SOLUTION | ORAL | Status: DC | PRN

## 2015-01-12 MED ORDER — PROPOFOL 10 MG/ML IV BOLUS
INTRAVENOUS | Status: DC | PRN
  Administered 2015-01-12: 150 mg via INTRAVENOUS

## 2015-01-12 MED ORDER — ACETAMINOPHEN 650 MG RE SUPP: 650.0000 mg | Freq: Four times a day (QID) | RECTAL | Status: DC | PRN

## 2015-01-12 MED ORDER — HYDROXYCHLOROQUINE SULFATE 200 MG PO TABS
200.0000 mg | ORAL_TABLET | Freq: Every day | ORAL | Status: DC
  Administered 2015-01-13 – 2015-01-14 (×2): 200 mg via ORAL
  Filled 2015-01-12 (×2): qty 1

## 2015-01-12 MED ORDER — SODIUM CHLORIDE 0.9 % IR SOLN
Status: DC | PRN
  Administered 2015-01-12: 3000 mL

## 2015-01-12 MED ORDER — EZETIMIBE 10 MG PO TABS
10.0000 mg | ORAL_TABLET | Freq: Every day | ORAL | Status: DC
  Administered 2015-01-13 – 2015-01-14 (×2): 10 mg via ORAL
  Filled 2015-01-12 (×3): qty 1

## 2015-01-12 MED ORDER — ACETAMINOPHEN 325 MG PO TABS: 650.0000 mg | ORAL_TABLET | Freq: Four times a day (QID) | ORAL | Status: DC | PRN

## 2015-01-12 MED ORDER — PROMETHAZINE HCL 25 MG/ML IJ SOLN: 6.2500 mg | INTRAMUSCULAR | Status: DC | PRN

## 2015-01-12 MED ORDER — ONDANSETRON HCL 4 MG PO TABS: 4.0000 mg | ORAL_TABLET | Freq: Three times a day (TID) | ORAL | Status: DC | PRN

## 2015-01-12 MED ORDER — FENTANYL CITRATE 0.05 MG/ML IJ SOLN
INTRAMUSCULAR | Status: DC | PRN
  Administered 2015-01-12: 100 ug via INTRAVENOUS

## 2015-01-12 MED ORDER — METHOCARBAMOL 500 MG PO TABS
500.0000 mg | ORAL_TABLET | Freq: Four times a day (QID) | ORAL | Status: DC | PRN
  Administered 2015-01-12 – 2015-01-13 (×2): 500 mg via ORAL
  Filled 2015-01-12 (×2): qty 1

## 2015-01-12 MED ORDER — FENTANYL CITRATE 0.05 MG/ML IJ SOLN
INTRAMUSCULAR | Status: AC
  Filled 2015-01-12: qty 2

## 2015-01-12 MED ORDER — MENTHOL 3 MG MT LOZG
1.0000 | LOZENGE | OROMUCOSAL | Status: DC | PRN
  Administered 2015-01-13: 3 mg via ORAL
  Administered 2015-01-13: 1 via ORAL
  Filled 2015-01-12 (×2): qty 9

## 2015-01-12 MED ORDER — ONDANSETRON HCL 4 MG/2ML IJ SOLN
INTRAMUSCULAR | Status: DC | PRN
  Administered 2015-01-12: 4 mg via INTRAVENOUS

## 2015-01-12 MED ORDER — ONDANSETRON HCL 4 MG PO TABS: 4.0000 mg | ORAL_TABLET | Freq: Four times a day (QID) | ORAL | Status: DC | PRN

## 2015-01-12 MED ORDER — ZOLPIDEM TARTRATE 5 MG PO TABS: 5.0000 mg | ORAL_TABLET | Freq: Every evening | ORAL | Status: DC | PRN

## 2015-01-12 MED ORDER — METHOCARBAMOL 500 MG PO TABS: 500.0000 mg | ORAL_TABLET | Freq: Four times a day (QID) | ORAL | Status: DC

## 2015-01-12 MED ORDER — DEXAMETHASONE SODIUM PHOSPHATE 10 MG/ML IJ SOLN
10.0000 mg | Freq: Once | INTRAMUSCULAR | Status: AC
  Administered 2015-01-13: 10 mg via INTRAVENOUS
  Filled 2015-01-12: qty 1

## 2015-01-12 MED ORDER — DIPHENHYDRAMINE HCL 50 MG/ML IJ SOLN
INTRAMUSCULAR | Status: DC | PRN
  Administered 2015-01-12: 12.5 mg via INTRAVENOUS

## 2015-01-12 MED ORDER — METOCLOPRAMIDE HCL 5 MG PO TABS: 5.0000 mg | ORAL_TABLET | Freq: Three times a day (TID) | ORAL | Status: DC | PRN

## 2015-01-12 MED ORDER — PHENYLEPHRINE HCL 10 MG/ML IJ SOLN
INTRAMUSCULAR | Status: DC | PRN
  Administered 2015-01-12 (×3): 80 ug via INTRAVENOUS

## 2015-01-12 MED ORDER — CETYLPYRIDINIUM CHLORIDE 0.05 % MT LIQD
7.0000 mL | Freq: Two times a day (BID) | OROMUCOSAL | Status: DC
  Administered 2015-01-12 – 2015-01-14 (×4): 7 mL via OROMUCOSAL

## 2015-01-12 MED ORDER — FLEET ENEMA 7-19 GM/118ML RE ENEM: 1.0000 | ENEMA | Freq: Once | RECTAL | Status: AC | PRN

## 2015-01-12 MED ORDER — DIPHENHYDRAMINE HCL 50 MG/ML IJ SOLN
INTRAMUSCULAR | Status: AC
  Filled 2015-01-12: qty 1

## 2015-01-12 MED ORDER — BUPIVACAINE LIPOSOME 1.3 % IJ SUSP
20.0000 mL | INTRAMUSCULAR | Status: DC
  Filled 2015-01-12: qty 20

## 2015-01-12 MED ORDER — SENNOSIDES-DOCUSATE SODIUM 8.6-50 MG PO TABS: 1.0000 | ORAL_TABLET | Freq: Every evening | ORAL | Status: DC | PRN

## 2015-01-12 MED ORDER — NEOSTIGMINE METHYLSULFATE 10 MG/10ML IV SOLN
INTRAVENOUS | Status: AC
  Filled 2015-01-12: qty 1

## 2015-01-12 MED ORDER — MIDAZOLAM HCL 2 MG/2ML IJ SOLN
INTRAMUSCULAR | Status: AC
  Filled 2015-01-12: qty 2

## 2015-01-12 MED ORDER — LIDOCAINE HCL (CARDIAC) 20 MG/ML IV SOLN
INTRAVENOUS | Status: AC
  Filled 2015-01-12: qty 5

## 2015-01-12 MED ORDER — LEVOTHYROXINE SODIUM 150 MCG PO TABS
150.0000 ug | ORAL_TABLET | Freq: Every day | ORAL | Status: DC
  Administered 2015-01-13 – 2015-01-14 (×2): 150 ug via ORAL
  Filled 2015-01-12 (×2): qty 1

## 2015-01-12 MED ORDER — NEOSTIGMINE METHYLSULFATE 10 MG/10ML IV SOLN
INTRAVENOUS | Status: DC | PRN
  Administered 2015-01-12: 3 mg via INTRAVENOUS

## 2015-01-12 MED ORDER — LISINOPRIL 10 MG PO TABS
10.0000 mg | ORAL_TABLET | Freq: Every day | ORAL | Status: DC
  Administered 2015-01-13 – 2015-01-14 (×2): 10 mg via ORAL
  Filled 2015-01-12 (×2): qty 1

## 2015-01-12 MED ORDER — CELECOXIB 200 MG PO CAPS
200.0000 mg | ORAL_CAPSULE | Freq: Two times a day (BID) | ORAL | Status: DC
  Administered 2015-01-12 – 2015-01-14 (×4): 200 mg via ORAL
  Filled 2015-01-12 (×4): qty 1

## 2015-01-12 MED ORDER — PHENYLEPHRINE 40 MCG/ML (10ML) SYRINGE FOR IV PUSH (FOR BLOOD PRESSURE SUPPORT)
PREFILLED_SYRINGE | INTRAVENOUS | Status: AC
  Filled 2015-01-12: qty 10

## 2015-01-12 MED ORDER — METOCLOPRAMIDE HCL 5 MG/ML IJ SOLN
INTRAMUSCULAR | Status: AC
  Filled 2015-01-12: qty 2

## 2015-01-12 MED ORDER — APIXABAN 2.5 MG PO TABS: ORAL_TABLET | ORAL | Status: DC

## 2015-01-12 MED ORDER — SODIUM CHLORIDE 0.9 % IV SOLN
INTRAVENOUS | Status: DC | PRN
  Administered 2015-01-12: 60 mL

## 2015-01-12 MED ORDER — METOCLOPRAMIDE HCL 5 MG/ML IJ SOLN
INTRAMUSCULAR | Status: DC | PRN
  Administered 2015-01-12: 10 mg via INTRAVENOUS

## 2015-01-12 MED ORDER — MIDAZOLAM HCL 5 MG/5ML IJ SOLN
INTRAMUSCULAR | Status: DC | PRN
  Administered 2015-01-12: 2 mg via INTRAVENOUS

## 2015-01-12 MED ORDER — PHENOL 1.4 % MT LIQD: 1.0000 | OROMUCOSAL | Status: DC | PRN

## 2015-01-12 MED ORDER — BISACODYL 5 MG PO TBEC
5.0000 mg | DELAYED_RELEASE_TABLET | Freq: Every day | ORAL | Status: DC | PRN
  Administered 2015-01-13: 5 mg via ORAL
  Filled 2015-01-12 (×2): qty 1

## 2015-01-12 MED ORDER — BUPIVACAINE HCL 0.5 % IJ SOLN
INTRAMUSCULAR | Status: DC | PRN
  Administered 2015-01-12: 10 mL

## 2015-01-12 MED ORDER — LISINOPRIL-HYDROCHLOROTHIAZIDE 10-12.5 MG PO TABS: 1.0000 | ORAL_TABLET | Freq: Every day | ORAL | Status: DC

## 2015-01-12 MED ORDER — LIDOCAINE HCL (CARDIAC) 20 MG/ML IV SOLN
INTRAVENOUS | Status: DC | PRN
  Administered 2015-01-12: 60 mg via INTRAVENOUS

## 2015-01-12 MED ORDER — OXYCODONE-ACETAMINOPHEN 5-325 MG PO TABS: 1.0000 | ORAL_TABLET | ORAL | Status: DC | PRN

## 2015-01-12 MED ORDER — PROPOFOL 10 MG/ML IV BOLUS
INTRAVENOUS | Status: AC
  Filled 2015-01-12: qty 20

## 2015-01-12 MED ORDER — APIXABAN 5 MG PO TABS
2.5000 mg | ORAL_TABLET | Freq: Two times a day (BID) | ORAL | Status: DC
  Filled 2015-01-12: qty 0.5

## 2015-01-12 MED ORDER — ALUM & MAG HYDROXIDE-SIMETH 200-200-20 MG/5ML PO SUSP: 30.0000 mL | ORAL | Status: DC | PRN

## 2015-01-12 MED ORDER — HYDROCHLOROTHIAZIDE 12.5 MG PO CAPS
12.5000 mg | ORAL_CAPSULE | Freq: Every day | ORAL | Status: DC
  Administered 2015-01-13 – 2015-01-14 (×2): 12.5 mg via ORAL
  Filled 2015-01-12 (×2): qty 1

## 2015-01-12 MED ORDER — GLYCOPYRROLATE 0.2 MG/ML IJ SOLN
INTRAMUSCULAR | Status: DC | PRN
  Administered 2015-01-12: 0.4 mg via INTRAVENOUS

## 2015-01-12 MED ORDER — OXYCODONE HCL 5 MG PO TABS
5.0000 mg | ORAL_TABLET | ORAL | Status: DC | PRN
  Administered 2015-01-12 – 2015-01-13 (×2): 5 mg via ORAL
  Administered 2015-01-13 – 2015-01-14 (×2): 10 mg via ORAL
  Filled 2015-01-12: qty 2
  Filled 2015-01-12: qty 1
  Filled 2015-01-12: qty 2
  Filled 2015-01-12: qty 1

## 2015-01-12 MED ORDER — BISACODYL 5 MG PO TBEC: 5.0000 mg | DELAYED_RELEASE_TABLET | Freq: Every day | ORAL | Status: DC | PRN

## 2015-01-12 MED ORDER — ALBUMIN HUMAN 5 % IV SOLN
12.5000 g | Freq: Once | INTRAVENOUS | Status: AC
  Administered 2015-01-12: 12.5 g via INTRAVENOUS

## 2015-01-12 MED ORDER — METHOCARBAMOL 1000 MG/10ML IJ SOLN
500.0000 mg | Freq: Four times a day (QID) | INTRAVENOUS | Status: DC | PRN
  Filled 2015-01-12: qty 5

## 2015-01-12 MED ORDER — GLYCOPYRROLATE 0.2 MG/ML IJ SOLN
INTRAMUSCULAR | Status: AC
  Filled 2015-01-12: qty 1

## 2015-01-12 MED ORDER — ATORVASTATIN CALCIUM 40 MG PO TABS
40.0000 mg | ORAL_TABLET | Freq: Every day | ORAL | Status: DC
  Administered 2015-01-13 – 2015-01-14 (×2): 40 mg via ORAL
  Filled 2015-01-12 (×2): qty 1

## 2015-01-12 MED ORDER — GLYCOPYRROLATE 0.2 MG/ML IJ SOLN
INTRAMUSCULAR | Status: AC
  Filled 2015-01-12: qty 2

## 2015-01-12 MED ORDER — HYDROMORPHONE HCL 1 MG/ML IJ SOLN
0.5000 mg | INTRAMUSCULAR | Status: DC | PRN
  Administered 2015-01-12 – 2015-01-13 (×2): 1 mg via INTRAVENOUS
  Filled 2015-01-12 (×2): qty 1

## 2015-01-12 SURGICAL SUPPLY — 67 items
APL SKNCLS STERI-STRIP NONHPOA (GAUZE/BANDAGES/DRESSINGS) ×1
BANDAGE ELASTIC 4 VELCRO ST LF (GAUZE/BANDAGES/DRESSINGS) ×2 IMPLANT
BANDAGE ELASTIC 6 VELCRO ST LF (GAUZE/BANDAGES/DRESSINGS) ×2 IMPLANT
BANDAGE ESMARK 6X9 LF (GAUZE/BANDAGES/DRESSINGS) ×1 IMPLANT
BENZOIN TINCTURE PRP APPL 2/3 (GAUZE/BANDAGES/DRESSINGS) ×2 IMPLANT
BLADE SAG 18X100X1.27 (BLADE) ×4 IMPLANT
BNDG CMPR 9X6 STRL LF SNTH (GAUZE/BANDAGES/DRESSINGS) ×1
BNDG ESMARK 6X9 LF (GAUZE/BANDAGES/DRESSINGS) ×2
BOWL SMART MIX CTS (DISPOSABLE) ×2 IMPLANT
CAPT HIP TOTAL 2 ×1 IMPLANT
CEMENT BONE SIMPLEX SPEEDSET (Cement) ×4 IMPLANT
COVER SURGICAL LIGHT HANDLE (MISCELLANEOUS) ×2 IMPLANT
CUFF TOURNIQUET SINGLE 34IN LL (TOURNIQUET CUFF) ×2 IMPLANT
DRAPE EXTREMITY T 121X128X90 (DRAPE) ×2 IMPLANT
DRAPE IMP U-DRAPE 54X76 (DRAPES) ×2 IMPLANT
DRAPE PROXIMA HALF (DRAPES) ×2 IMPLANT
DRAPE U-SHAPE 47X51 STRL (DRAPES) ×2 IMPLANT
DRSG PAD ABDOMINAL 8X10 ST (GAUZE/BANDAGES/DRESSINGS) ×2 IMPLANT
DURAPREP 26ML APPLICATOR (WOUND CARE) ×4 IMPLANT
ELECT CAUTERY BLADE 6.4 (BLADE) ×2 IMPLANT
ELECT REM PT RETURN 9FT ADLT (ELECTROSURGICAL) ×2
ELECTRODE REM PT RTRN 9FT ADLT (ELECTROSURGICAL) ×1 IMPLANT
EVACUATOR 1/8 PVC DRAIN (DRAIN) ×2 IMPLANT
FACESHIELD WRAPAROUND (MASK) ×4 IMPLANT
FACESHIELD WRAPAROUND OR TEAM (MASK) ×2 IMPLANT
GAUZE SPONGE 4X4 12PLY STRL (GAUZE/BANDAGES/DRESSINGS) ×2 IMPLANT
GLOVE BIOGEL PI IND STRL 7.0 (GLOVE) ×1 IMPLANT
GLOVE BIOGEL PI INDICATOR 7.0 (GLOVE) ×1
GLOVE ECLIPSE 7.0 STRL STRAW (GLOVE) ×2 IMPLANT
GLOVE ORTHO TXT STRL SZ7.5 (GLOVE) ×2 IMPLANT
GOWN STRL REUS W/ TWL LRG LVL3 (GOWN DISPOSABLE) ×2 IMPLANT
GOWN STRL REUS W/ TWL XL LVL3 (GOWN DISPOSABLE) ×1 IMPLANT
GOWN STRL REUS W/TWL LRG LVL3 (GOWN DISPOSABLE) ×4
GOWN STRL REUS W/TWL XL LVL3 (GOWN DISPOSABLE) ×2
HANDPIECE INTERPULSE COAX TIP (DISPOSABLE) ×2
IMMOBILIZER KNEE 22 UNIV (SOFTGOODS) ×2 IMPLANT
IMMOBILIZER KNEE 24 THIGH 36 (MISCELLANEOUS) IMPLANT
IMMOBILIZER KNEE 24 UNIV (MISCELLANEOUS)
KIT BASIN OR (CUSTOM PROCEDURE TRAY) ×2 IMPLANT
KIT ROOM TURNOVER OR (KITS) ×2 IMPLANT
MANIFOLD NEPTUNE II (INSTRUMENTS) ×2 IMPLANT
NDL 18GX1X1/2 (RX/OR ONLY) (NEEDLE) ×1 IMPLANT
NDL HYPO 25GX1X1/2 BEV (NEEDLE) ×1 IMPLANT
NEEDLE 18GX1X1/2 (RX/OR ONLY) (NEEDLE) ×2 IMPLANT
NEEDLE HYPO 25GX1X1/2 BEV (NEEDLE) ×2 IMPLANT
NS IRRIG 1000ML POUR BTL (IV SOLUTION) ×2 IMPLANT
PACK TOTAL JOINT (CUSTOM PROCEDURE TRAY) ×2 IMPLANT
PACK UNIVERSAL I (CUSTOM PROCEDURE TRAY) ×2 IMPLANT
PAD ARMBOARD 7.5X6 YLW CONV (MISCELLANEOUS) ×4 IMPLANT
PAD CAST 4YDX4 CTTN HI CHSV (CAST SUPPLIES) ×1 IMPLANT
PADDING CAST COTTON 4X4 STRL (CAST SUPPLIES) ×4
PADDING CAST COTTON 6X4 STRL (CAST SUPPLIES) ×2 IMPLANT
SET HNDPC FAN SPRY TIP SCT (DISPOSABLE) ×1 IMPLANT
STRIP CLOSURE SKIN 1/2X4 (GAUZE/BANDAGES/DRESSINGS) ×3 IMPLANT
SUCTION FRAZIER TIP 10 FR DISP (SUCTIONS) ×2 IMPLANT
SUT MNCRL AB 4-0 PS2 18 (SUTURE) ×2 IMPLANT
SUT VIC AB 0 CT1 27 (SUTURE)
SUT VIC AB 0 CT1 27XBRD ANBCTR (SUTURE) IMPLANT
SUT VIC AB 1 CT1 27 (SUTURE) ×4
SUT VIC AB 1 CT1 27XBRD ANBCTR (SUTURE) ×2 IMPLANT
SUT VIC AB 2-0 CT1 27 (SUTURE) ×4
SUT VIC AB 2-0 CT1 TAPERPNT 27 (SUTURE) ×2 IMPLANT
SYR 50ML LL SCALE MARK (SYRINGE) ×2 IMPLANT
SYR CONTROL 10ML LL (SYRINGE) ×2 IMPLANT
TOWEL OR 17X24 6PK STRL BLUE (TOWEL DISPOSABLE) ×2 IMPLANT
TOWEL OR 17X26 10 PK STRL BLUE (TOWEL DISPOSABLE) ×2 IMPLANT
WATER STERILE IRR 1000ML POUR (IV SOLUTION) ×2 IMPLANT

## 2015-01-12 NOTE — Progress Notes (Signed)
Orthopedic Tech Progress Note Patient Details:  Hannah Castillo August 19, 1941 [id number]  CPM Left Knee CPM Left Knee: On Left Knee Flexion (Degrees): 70 Left Knee Extension (Degrees): 0  Ortho Devices Ortho Device/Splint Location: applied overhead frame, and footsie on bed Ortho Device/Splint Interventions: Ordered, Application   Braulio Bosch 01/12/2015, 5:06 PM

## 2015-01-12 NOTE — Anesthesia Procedure Notes (Signed)
Procedure Name: Intubation Date/Time: 01/12/2015 1:02 PM Performed by: Manus Gunning, Maxxon Schwanke J Pre-anesthesia Checklist: Patient identified, Timeout performed, Emergency Drugs available, Suction available and Patient being monitored Patient Re-evaluated:Patient Re-evaluated prior to inductionOxygen Delivery Method: Circle system utilized Preoxygenation: Pre-oxygenation with 100% oxygen Intubation Type: IV induction Laryngoscope Size: Mac and 3 Grade View: Grade I Tube type: Oral Tube size: 7.0 mm Number of attempts: 1 Placement Confirmation: ETT inserted through vocal cords under direct vision,  breath sounds checked- equal and bilateral and positive ETCO2 Secured at: 21 cm Tube secured with: Tape Dental Injury: Teeth and Oropharynx as per pre-operative assessment

## 2015-01-12 NOTE — Progress Notes (Signed)
Hemovac drainage 250 ml in first 30 min in PACU. hemovac clamped for four hours

## 2015-01-12 NOTE — Anesthesia Postprocedure Evaluation (Signed)
  Anesthesia Post-op Note  Patient: Hannah Castillo  Procedure(s) Performed: Procedure(s): TOTAL KNEE ARTHROPLASTY (Left)  Patient Location: PACU  Anesthesia Type:General  Level of Consciousness: awake, alert  and oriented  Airway and Oxygen Therapy: Patient Spontanous Breathing and Patient connected to nasal cannula oxygen  Post-op Pain: mild  Post-op Assessment: Post-op Vital signs reviewed, Patient's Cardiovascular Status Stable, Respiratory Function Stable, Patent Airway, No signs of Nausea or vomiting and Pain level controlled  Post-op Vital Signs: stable  Last Vitals:  Filed Vitals:   01/12/15 1748  BP: 114/42  Pulse: 74  Temp: 36.4 C  Resp: 16    Complications: No apparent anesthesia complications

## 2015-01-12 NOTE — Transfer of Care (Signed)
Immediate Anesthesia Transfer of Care Note  Patient: Hannah Castillo  Procedure(s) Performed: Procedure(s): TOTAL KNEE ARTHROPLASTY (Left)  Patient Location: PACU  Anesthesia Type:General  Level of Consciousness: awake and alert   Airway & Oxygen Therapy: Patient Spontanous Breathing and Patient connected to nasal cannula oxygen  Post-op Assessment: Report given to RN and Post -op Vital signs reviewed and stable  Post vital signs: Reviewed and stable  Last Vitals:  Filed Vitals:   01/12/15 1445  BP:   Pulse:   Temp: 36.8 C  Resp:     Complications: No apparent anesthesia complications

## 2015-01-12 NOTE — Interval H&P Note (Signed)
History and Physical Interval Note:  01/12/2015 8:20 AM  Hannah Castillo  has presented today for surgery, with the diagnosis of DJD LEFT KNEE  The various methods of treatment have been discussed with the patient and family. After consideration of risks, benefits and other options for treatment, the patient has consented to  Procedure(s): TOTAL KNEE ARTHROPLASTY (Left) as a surgical intervention .  The patient's history has been reviewed, patient examined, no change in status, stable for surgery.  I have reviewed the patient's chart and labs.  Questions were answered to the patient's satisfaction.     [name] F

## 2015-01-12 NOTE — H&P (View-Only) (Signed)
TOTAL KNEE ADMISSION H&P  Patient is being admitted for left total knee arthroplasty.  Subjective:  Chief Complaint:left knee pain.  HPI: Hannah Castillo, 74 y.o. female, has a history of pain and functional disability in the left knee due to arthritis and has failed non-surgical conservative treatments for greater than 12 weeks to includeNSAID's and/or analgesics, corticosteriod injections and viscosupplementation injections.  Onset of symptoms was gradual, starting 3 years ago with rapidlly worsening course since that time. The patient noted no past surgery on the left knee(s).  Patient currently rates pain in the left knee(s) at 5 out of 10 with activity. Patient has night pain, worsening of pain with activity and weight bearing and pain that interferes with activities of daily living.  Patient has evidence of subchondral sclerosis and joint space narrowing by imaging studies. There is no active infection.  There are no active problems to display for this patient.  Past Medical History  Diagnosis Date  . Hypothyroidism   . Rheumatoid arthritis   . Hypertension     Seen Dr. Nadyne Coombes for clearnence  . PONV (postoperative nausea and vomiting)   . Degenerative joint disease     Past Surgical History  Procedure Laterality Date  . Thyroidectomy, partial    . Appendectomy    . Abdominal hysterectomy    . Lazer eye surgery    . Cataract extraction    . Total knee arthroplasty  10/17/2011    Procedure: TOTAL KNEE ARTHROPLASTY;  Surgeon: Ninetta Lights, MD;  Location: [place];  Service: Orthopedics;  Laterality: Right;  120 MINUTES FOR THIS SURGERY     (Not in a hospital admission) Allergies  Allergen Reactions  . Hydrocodone Nausea Only    MAKES PT FEEL VERY "SICK"    History  Substance Use Topics  . Smoking status: Former Smoker -- 0.50 packs/day for 40 years    Quit date: 10/01/2001  . Smokeless tobacco: Never Used  . Alcohol Use: No    Family History  Problem Relation Age of  Onset  . Anesthesia problems Neg Hx      Review of Systems  Constitutional: Negative.   HENT: Negative.   Eyes: Negative.   Respiratory: Negative.   Cardiovascular: Negative.   Gastrointestinal: Negative.   Genitourinary: Negative.   Musculoskeletal: Positive for joint pain.  Skin: Negative.   Neurological: Negative.   Endo/Heme/Allergies: Negative for environmental allergies. Bruises/bleeds easily.  Psychiatric/Behavioral: Negative.     Objective:  Physical Exam  Constitutional: She is oriented to person, place, and time. She appears well-developed and well-nourished. No distress.  HENT:  Head: Normocephalic and atraumatic.  Eyes: EOM are normal. Pupils are equal, round, and reactive to light.  Neck: Normal range of motion. Neck supple.  Cardiovascular: Normal rate and regular rhythm.   Respiratory: Effort normal and breath sounds normal. No respiratory distress. She has no wheezes. She has no rales.  GI: Soft. Bowel sounds are normal. She exhibits distension. There is no tenderness.  Musculoskeletal:  Antalgic gait with valgus thrust on the left.  Negative log roll of both hips.  Negative straight leg raise.  Neurovascularly intact.  Right knee good alignment.  Good stability.  Motion 0-120.  Left knee unstable into valgus, more than 10 degrees, partially correctable.  Tibiofemoral and patellofemoral crepitus.  Still full extension, slight extensor lag.  Flexion 110 degrees.    Neurological: She is alert and oriented to person, place, and time.  Skin: Skin is warm and dry.  Psychiatric:  She has a normal mood and affect. Her behavior is normal. Judgment and thought content normal.    Vital signs in last 24 hours: @VSRANGES @  Labs:   Estimated body mass index is 30.70 kg/(m^2) as calculated from the following:   Height as of 10/17/11: 5\' 5"  (1.651 m).   Weight as of 10/12/11: 83.689 kg (184 lb 8 oz).   Imaging Review Plain radiographs demonstrate severe degenerative  joint disease of the left knee(s). The overall alignment ismild valgus. The bone quality appears to be fair for age and reported activity level.  Assessment/Plan:  End stage arthritis, left knee   The patient history, physical examination, clinical judgment of the provider and imaging studies are consistent with end stage degenerative joint disease of the left knee(s) and total knee arthroplasty is deemed medically necessary. The treatment options including medical management, injection therapy arthroscopy and arthroplasty were discussed at length. The risks and benefits of total knee arthroplasty were presented and reviewed. The risks due to aseptic loosening, infection, stiffness, patella tracking problems, thromboembolic complications and other imponderables were discussed. The patient acknowledged the explanation, agreed to proceed with the plan and consent was signed. Patient is being admitted for inpatient treatment for surgery, pain control, PT, OT, prophylactic antibiotics, VTE prophylaxis, progressive ambulation and ADL's and discharge planning. The patient is planning to be discharged to skilled nursing facility

## 2015-01-12 NOTE — Anesthesia Preprocedure Evaluation (Addendum)
Anesthesia Evaluation  Patient identified by MRN, date of birth, ID band Patient awake    Reviewed: Allergy & Precautions, NPO status , Patient's Chart, lab work & pertinent test results  History of Anesthesia Complications (+) PONV  Airway Mallampati: II  TM Distance: >3 FB Neck ROM: Full    Dental   Pulmonary former smoker,  breath sounds clear to auscultation        Cardiovascular hypertension, Rhythm:Regular Rate:Normal     Neuro/Psych    GI/Hepatic negative GI ROS, Neg liver ROS,   Endo/Other  diabetesHypothyroidism   Renal/GU negative Renal ROS     Musculoskeletal   Abdominal   Peds  Hematology   Anesthesia Other Findings   Reproductive/Obstetrics                            Anesthesia Physical Anesthesia Plan  ASA: III  Anesthesia Plan: General   Post-op Pain Management:    Induction: Intravenous  Airway Management Planned: Oral ETT  Additional Equipment:   Intra-op Plan:   Post-operative Plan:   Informed Consent: I have reviewed the patients History and Physical, chart, labs and discussed the procedure including the risks, benefits and alternatives for the proposed anesthesia with the patient or authorized representative who has indicated his/her understanding and acceptance.   Dental advisory given  Plan Discussed with: CRNA and Anesthesiologist  Anesthesia Plan Comments:        Anesthesia Quick Evaluation

## 2015-01-12 NOTE — Discharge Summary (Addendum)
Patient ID: Hannah Castillo MRN: [id number] DOB/AGE: [birth year] 74 y.o.  Admit date: 01/12/2015 Discharge date: 01/14/2015  Admission Diagnoses:  Active Problems:   DJD (degenerative joint disease) of knee   Discharge Diagnoses:  Same  Past Medical History  Diagnosis Date  . PONV (postoperative nausea and vomiting)   . Hypertension     takes Lisinopril-HCTZ daily  . Hypothyroidism     takes Synthroid daily  . Hyperlipidemia     takes Zetia and Atorvastatin daily  . History of bronchitis     "a long time ago"  . Joint pain   . Rheumatoid arthritis(714.0)     takes Plaquenil daily  . Degenerative joint disease   . Diabetes mellitus without complication     "prediabetic"    Surgeries: Procedure(s): TOTAL KNEE ARTHROPLASTY on 01/12/2015   Consultants:    Discharged Condition: Improved  Hospital Course: Hannah Castillo is an 74 y.o. female who was admitted 01/12/2015 for operative treatment of primary localized osteoarthritis left knee. Patient has severe unremitting pain that affects sleep, daily activities, and work/hobbies. After pre-op clearance the patient was taken to the operating room on 01/12/2015 and underwent  Procedure(s): TOTAL KNEE ARTHROPLASTY.  Patient with a pre-op Hb of 13.0 developed ABLA on pod #1 with a Hb of 10.1 and 9.6 on pod#2.  She is currently mildly symptomatic.  We will continue to follow.  Patient was given perioperative antibiotics:      Anti-infectives    Start     Dose/Rate Route Frequency Ordered Stop   01/12/15 1900  ceFAZolin (ANCEF) IVPB 2 g/50 mL premix     2 g 100 mL/hr over 30 Minutes Intravenous Every 6 hours 01/12/15 1742 01/13/15 0148   01/12/15 1745  hydroxychloroquine (PLAQUENIL) tablet 200 mg     200 mg Oral Daily 01/12/15 1742     01/12/15 0600  ceFAZolin (ANCEF) IVPB 2 g/50 mL premix     2 g 100 mL/hr over 30 Minutes Intravenous On call to O.R. 01/11/15 1341 01/12/15 1256       Patient was given sequential  compression devices, early ambulation, and chemoprophylaxis to prevent DVT.  Patient benefited maximally from hospital stay and there were no complications.    Recent vital signs:  Patient Vitals for the past 24 hrs:  BP Temp Temp src Pulse Resp SpO2  01/14/15 0641 (!) 116/50 mmHg 98.2 F (36.8 C) Oral 78 - 98 %  01/14/15 0400 - - - - 17 97 %  01/14/15 0000 - - - - 17 97 %  01/13/15 2059 (!) 111/57 mmHg 98.9 F (37.2 C) Oral 76 17 97 %  01/13/15 2000 - - - - 17 97 %  01/13/15 1254 (!) 120/54 mmHg 98.2 F (36.8 C) - 70 18 100 %  01/13/15 1158 - - - - 18 -  01/13/15 0800 - - - - 18 -     Recent laboratory studies:   Recent Labs  01/13/15 0530 01/14/15 0543  WBC 6.7 13.0*  HGB 10.1* 9.6*  HCT 30.5* 28.4*  PLT 240 217  NA 138 136  K 3.6 4.0  CL 102 102  CO2 28 24  BUN 11 17  CREATININE 0.89 0.99  GLUCOSE 118* 116*  CALCIUM 9.0 9.6     Discharge Medications:     Medication List    STOP taking these medications        aspirin EC 81 MG tablet     OMEGA 3 PO  TAKE these medications        atorvastatin 40 MG tablet  Commonly known as:  LIPITOR  Take 40 mg by mouth daily.     bisacodyl 5 MG EC tablet  Commonly known as:  DULCOLAX  Take 1 tablet (5 mg total) by mouth daily as needed for moderate constipation.     ELIQUIS 2.5 MG Tabs tablet  Generic drug:  apixaban  Take 2.5 mg by mouth 2 (two) times daily.     ezetimibe 10 MG tablet  Commonly known as:  ZETIA  Take 10 mg by mouth daily.     hydroxychloroquine 200 MG tablet  Commonly known as:  PLAQUENIL  Take 200 mg by mouth daily.     levothyroxine 150 MCG tablet  Commonly known as:  SYNTHROID, LEVOTHROID  Take 150 mcg by mouth daily.     lisinopril-hydrochlorothiazide 10-12.5 MG per tablet  Commonly known as:  PRINZIDE,ZESTORETIC  Take 1 tablet by mouth daily.     methocarbamol 500 MG tablet  Commonly known as:  ROBAXIN  Take 1 tablet (500 mg total) by mouth 4 (four) times daily.      ondansetron 4 MG tablet  Commonly known as:  ZOFRAN  Take 1 tablet (4 mg total) by mouth every 8 (eight) hours as needed for nausea or vomiting.     oxyCODONE-acetaminophen 5-325 MG per tablet  Commonly known as:  ROXICET  Take 1-2 tablets by mouth every 4 (four) hours as needed.     VITAMIN C PO  Take 1 tablet by mouth daily.     Vitamin D3 50000 UNITS Caps  Take 50,000 Units by mouth once a week.        Diagnostic Studies: Dg Knee Left Port  01/12/2015   CLINICAL DATA:  Status post total knee replacement  EXAM: PORTABLE LEFT KNEE - 1-2 VIEW  COMPARISON:  None.  FINDINGS: Frontal and lateral views were obtained. Patient is status post total knee replacement with femoral and tibial prosthetic components appearing well-seated. No fracture dislocation. There is a drain lateral to the distal tibial metaphysis. Soft tissue air is an expected postoperative finding.  IMPRESSION: Prosthetic components appear well seated. No acute fracture or dislocation.   Electronically Signed   By: Lowella Grip III M.D.   On: 01/12/2015 16:49    Disposition: 03-Skilled Nursing Facility  Discharge Instructions    CPM    Complete by:  As directed   Continuous passive motion machine (CPM):      Use the CPM from 0- to 60 for 6 hours per day.      You may increase by 10 per day.  You may break it up into 2 or 3 sessions per day.      Use CPM for 2-3 weeks or until you are told to stop.     Call MD / Call 911    Complete by:  As directed   If you experience chest pain or shortness of breath, CALL 911 and be transported to the hospital emergency room.  If you develope a fever above 101 F, pus (white drainage) or increased drainage or redness at the wound, or calf pain, call your surgeon's office.     Change dressing    Complete by:  As directed   Change dressing on saturday, then change the dressing daily with sterile 4 x 4 inch gauze dressing and apply TED hose.  You may clean the incision with alcohol  prior to redressing.  Constipation Prevention    Complete by:  As directed   Drink plenty of fluids.  Prune juice may be helpful.  You may use a stool softener, such as Colace (over the counter) 100 mg twice a day.  Use MiraLax (over the counter) for constipation as needed.     Diet - low sodium heart healthy    Complete by:  As directed      Discharge instructions    Complete by:  As directed   INSTRUCTIONS AFTER JOINT REPLACEMENT   Remove items at home which could result in a fall. This includes throw rugs or furniture in walking pathways ICE to the affected joint every three hours while awake for 30 minutes at a time, for at least the first 3-5 days, and then as needed for pain and swelling.  Continue to use ice for pain and swelling. You may notice swelling that will progress down to the foot and ankle.  This is normal after surgery.  Elevate your leg when you are not up walking on it.   Continue to use the breathing machine you got in the hospital (incentive spirometer) which will help keep your temperature down.  It is common for your temperature to cycle up and down following surgery, especially at night when you are not up moving around and exerting yourself.  The breathing machine keeps your lungs expanded and your temperature down.  BLOOD THINNER: TAKE ELIQUIS AS DIRECTED FOR A TOTAL OF 12 DAYS FOLLOWING SURGERY TO PREVENT BLOOD CLOTS.  ONCE FINISHED WITH ELIQUIS PRESCRIPTION, YOU MAY RESUME ASPIRIN 81 MG.    DIET:  As you were doing prior to hospitalization, we recommend a well-balanced diet.  DRESSING / WOUND CARE / SHOWERING  You may change your dressing 3-5 days after surgery.  Then change the dressing every day with sterile gauze.  Please use good hand washing techniques before changing the dressing.  Do not use any lotions or creams on the incision until instructed by your surgeon. and You may shower 3 days after surgery, but keep the wounds dry during showering.  You may use  an occlusive plastic wrap (Press'n Seal for example), NO SOAKING/SUBMERGING IN THE BATHTUB.  If the bandage gets wet, change with a clean dry gauze.  If the incision gets wet, pat the wound dry with a clean towel.  ACTIVITY  Increase activity slowly as tolerated, but follow the weight bearing instructions below.   No driving for 6 weeks or until further direction given by your physician.  You cannot drive while taking narcotics.  No lifting or carrying greater than 10 lbs. until further directed by your surgeon. Avoid periods of inactivity such as sitting longer than an hour when not asleep. This helps prevent blood clots.  You may return to work once you are authorized by your doctor.     WEIGHT BEARING   Weight bearing as tolerated with assist device (walker, cane, etc) as directed, use it as long as suggested by your surgeon or therapist, typically at least 4-6 weeks.   EXERCISES  Results after joint replacement surgery are often greatly improved when you follow the exercise, range of motion and muscle strengthening exercises prescribed by your doctor. Safety measures are also important to protect the joint from further injury. Any time any of these exercises cause you to have increased pain or swelling, decrease what you are doing until you are comfortable again and then slowly increase them. If you have problems or questions, call  your caregiver or physical therapist for advice.   Rehabilitation is important following a joint replacement. After just a few days of immobilization, the muscles of the leg can become weakened and shrink (atrophy).  These exercises are designed to build up the tone and strength of the thigh and leg muscles and to improve motion. Often times heat used for twenty to thirty minutes before working out will loosen up your tissues and help with improving the range of motion but do not use heat for the first two weeks following surgery (sometimes heat can increase  post-operative swelling).   These exercises can be done on a training (exercise) mat, on the floor, on a table or on a bed. Use whatever works the best and is most comfortable for you.    Use music or television while you are exercising so that the exercises are a pleasant break in your day. This will make your life better with the exercises acting as a break in your routine that you can look forward to.   Perform all exercises about fifteen times, three times per day or as directed.  You should exercise both the operative leg and the other leg as well.  Exercises include:   Quad Sets - Tighten up the muscle on the front of the thigh (Quad) and hold for 5-10 seconds.   Straight Leg Raises - With your knee straight (if you were given a brace, keep it on), lift the leg to 60 degrees, hold for 3 seconds, and slowly lower the leg.  Perform this exercise against resistance later as your leg gets stronger.  Leg Slides: Lying on your back, slowly slide your foot toward your buttocks, bending your knee up off the floor (only go as far as is comfortable). Then slowly slide your foot back down until your leg is flat on the floor again.  Angel Wings: Lying on your back spread your legs to the side as far apart as you can without causing discomfort.  Hamstring Strength:  Lying on your back, push your heel against the floor with your leg straight by tightening up the muscles of your buttocks.  Repeat, but this time bend your knee to a comfortable angle, and push your heel against the floor.  You may put a pillow under the heel to make it more comfortable if necessary.   A rehabilitation program following joint replacement surgery can speed recovery and prevent re-injury in the future due to weakened muscles. Contact your doctor or a physical therapist for more information on knee rehabilitation.    CONSTIPATION  Constipation is defined medically as fewer than three stools per week and severe constipation as less  than one stool per week.  Even if you have a regular bowel pattern at home, your normal regimen is likely to be disrupted due to multiple reasons following surgery.  Combination of anesthesia, postoperative narcotics, change in appetite and fluid intake all can affect your bowels.   YOU MUST use at least one of the following options; they are listed in order of increasing strength to get the job done.  They are all available over the counter, and you may need to use some, POSSIBLY even all of these options:    Drink plenty of fluids (prune juice may be helpful) and high fiber foods Colace 100 mg by mouth twice a day  Senokot for constipation as directed and as needed Dulcolax (bisacodyl), take with full glass of water  Miralax (polyethylene glycol) once or  twice a day as needed.  If you have tried all these things and are unable to have a bowel movement in the first 3-4 days after surgery call either your surgeon or your primary doctor.    If you experience loose stools or diarrhea, hold the medications until you stool forms back up.  If your symptoms do not get better within 1 week or if they get worse, check with your doctor.  If you experience "the worst abdominal pain ever" or develop nausea or vomiting, please contact the office immediately for further recommendations for treatment.   ITCHING:  If you experience itching with your medications, try taking only a single pain pill, or even half a pain pill at a time.  You can also use Benadryl over the counter for itching or also to help with sleep.   TED HOSE STOCKINGS:  Use stockings on both legs until for at least 2 weeks or as directed by physician office. They may be removed at night for sleeping.  MEDICATIONS:  See your medication summary on the "After Visit Summary" that nursing will review with you.  You may have some home medications which will be placed on hold until you complete the course of blood thinner medication.  It is important  for you to complete the blood thinner medication as prescribed.  PRECAUTIONS:  If you experience chest pain or shortness of breath - call 911 immediately for transfer to the hospital emergency department.   If you develop a fever greater that 101 F, purulent drainage from wound, increased redness or drainage from wound, foul odor from the wound/dressing, or calf pain - CONTACT YOUR SURGEON.                                                   FOLLOW-UP APPOINTMENTS:  If you do not already have a post-op appointment, please call the office for an appointment to be seen by your surgeon.  Guidelines for how soon to be seen are listed in your "After Visit Summary", but are typically between 1-4 weeks after surgery.  OTHER INSTRUCTIONS:   Knee Replacement:  Do not place pillow under knee, focus on keeping the knee straight while resting. CPM instructions: 0-90 degrees, 2 hours in the morning, 2 hours in the afternoon, and 2 hours in the evening. Place foam block, curve side up under heel at all times except when in CPM or when walking.  DO NOT modify, tear, cut, or change the foam block in any way.  MAKE SURE YOU:  Understand these instructions.  Get help right away if you are not doing well or get worse.     Do not put a pillow under the knee. Place it under the heel.    Complete by:  As directed   Place gray foam under operative heel when in bed or in a chair to work on extension     Increase activity slowly as tolerated    Complete by:  As directed      TED hose    Complete by:  As directed   Use stockings (TED hose) for 2 weeks on both leg(s).  You may remove them at night for sleeping.           Follow-up Information    Follow up with Ninetta Lights, MD. Schedule an appointment  as soon as possible for a visit in 2 weeks.   Specialty:  Orthopedic Surgery   Contact information:   [place] [address] [phone]        Signed: Fannie Knee 01/14/2015, 7:45 AM

## 2015-01-13 ENCOUNTER — Encounter (HOSPITAL_COMMUNITY): Payer: Self-pay | Admitting: General Practice

## 2015-01-13 LAB — BASIC METABOLIC PANEL
ANION GAP: 8 (ref 5–15)
BUN: 11 mg/dL (ref 6–23)
CALCIUM: 9 mg/dL (ref 8.4–10.5)
CO2: 28 mmol/L (ref 19–32)
Chloride: 102 mmol/L (ref 96–112)
Creatinine, Ser: 0.89 mg/dL (ref 0.50–1.10)
GFR calc Af Amer: 72 mL/min — ABNORMAL LOW (ref >90)
GFR, EST NON AFRICAN AMERICAN: 62 mL/min — AB (ref >90)
Glucose, Bld: 118 mg/dL — ABNORMAL HIGH (ref 70–99)
Potassium: 3.6 mmol/L (ref 3.5–5.1)
SODIUM: 138 mmol/L (ref 135–145)

## 2015-01-13 LAB — CBC
HEMATOCRIT: 30.5 % — AB (ref 36.0–46.0)
Hemoglobin: 10.1 g/dL — ABNORMAL LOW (ref 12.0–15.0)
MCH: 29.8 pg (ref 26.0–34.0)
MCHC: 33.1 g/dL (ref 30.0–36.0)
MCV: 90 fL (ref 78.0–100.0)
Platelets: 240 10*3/uL (ref 150–400)
RBC: 3.39 MIL/uL — ABNORMAL LOW (ref 3.87–5.11)
RDW: 13.6 % (ref 11.5–15.5)
WBC: 6.7 10*3/uL (ref 4.0–10.5)

## 2015-01-13 MED ORDER — APIXABAN 2.5 MG PO TABS
2.5000 mg | ORAL_TABLET | Freq: Two times a day (BID) | ORAL | Status: DC
  Administered 2015-01-13 – 2015-01-14 (×3): 2.5 mg via ORAL
  Filled 2015-01-13 (×3): qty 1
  Filled 2015-01-13: qty 0.5

## 2015-01-13 NOTE — Evaluation (Signed)
Occupational Therapy Evaluation Patient Details Name: Hannah Castillo MRN: [id number] DOB: 06-25-1941 Today's Date: 01/13/2015    History of Present Illness Pt is a 74 y/o F s/p L TKA.  Pt's PMH includes R TKA, HTN, PONV, and RA.   Clinical Impression   Patient indepenent PTA. Patient currently requires mod>max for LB ADLs and min>mod for functional transfers and mobility using RW. Patient will benefit from acute OT to increase overall independence in the areas of ADLs, functional mobility, and overall safety in order to safely discharge to venue listed below.     Follow Up Recommendations  SNF;Supervision/Assistance - 24 hour    Equipment Recommendations  Other (comment) (TBD, next venue of care)    Recommendations for Other Services  None at this time     Precautions / Restrictions Precautions Precautions: Fall;Knee Precaution Comments: No pillow under knee Required Braces or Orthoses: Knee Immobilizer - Left Knee Immobilizer - Left:  (not specified in order) Restrictions Weight Bearing Restrictions: Yes LLE Weight Bearing: Weight bearing as tolerated      Mobility Bed Mobility - Patient found seated in recliner upon OT entering room, please see PT note for more information  Transfers Overall transfer level: Needs assistance Equipment used: Rolling walker (2 wheeled) Transfers: Sit to/from Stand Sit to Stand: Mod assist         General transfer comment: Cues and assistance for sit<>stand (assistance needed to lift and lower patient)    Balance Overall balance assessment: Needs assistance Sitting-balance support: No upper extremity supported;Feet supported Sitting balance-Leahy Scale: Fair     Standing balance support: Bilateral upper extremity supported;During functional activity Standing balance-Leahy Scale: Poor    ADL Overall ADL's : Needs assistance/impaired  General ADL Comments: Patient unable to reach BLEs for LB ADLs. Patient mod assist for  sit<>stand transfers and min assist for stand pivot transfers. Patient with decreased standing balance/endurance and requires min guard>min assist for functional mobility using RW. Patient lives alone and will benefit from ST-SNF prior to d/c home for safe transition.     Pertinent Vitals/Pain Pain Assessment: No/denies pain Pain Score: 4  Pain Location: left knee Pain Descriptors / Indicators: Sore Pain Intervention(s): Monitored during session;Patient requesting pain meds-RN notified     Hand Dominance Right   Extremity/Trunk Assessment Upper Extremity Assessment Upper Extremity Assessment: Overall WFL for tasks assessed   Lower Extremity Assessment Lower Extremity Assessment: Defer to PT evaluation   Cervical / Trunk Assessment Cervical / Trunk Assessment: Normal   Communication Communication Communication: No difficulties   Cognition Arousal/Alertness: Awake/alert Behavior During Therapy: WFL for tasks assessed/performed Overall Cognitive Status: Within Functional Limits for tasks assessed              Home Living Family/patient expects to be discharged to:: Skilled nursing facility Living Arrangements: Alone Available Help at Discharge: Family;Available PRN/intermittently (daughters plan on staying post rehab stay) Type of Home: House       Home Layout: Multi-level   Alternate Level Stairs-Rails: Can reach both Bathroom Shower/Tub: Walk-in shower;Curtain;Tub/shower unit   Bathroom Toilet: Handicapped height     Home Equipment: Bedside commode;Walker - 2 wheels   Additional Comments: Pt with 3 story house. Bathroom on 1st, kitchen on 2nd, bedroom on 3rd      Prior Functioning/Environment Level of Independence: Independent     OT Diagnosis: Generalized weakness;Acute pain   OT Problem List: Decreased strength;Decreased range of motion;Decreased activity tolerance;Impaired balance (sitting and/or standing);Decreased safety awareness;Decreased knowledge of  use of DME  or AE;Decreased knowledge of precautions;Pain   OT Treatment/Interventions: Self-care/ADL training;Therapeutic exercise;Energy conservation;DME and/or AE instruction;Therapeutic activities;Patient/family education;Balance training    OT Goals(Current goals can be found in the care plan section) Acute Rehab OT Goals Patient Stated Goal: get stronger OT Goal Formulation: With patient/family Time For Goal Achievement: 01/20/15 Potential to Achieve Goals: Good ADL Goals Pt Will Perform Grooming: with modified independence;standing Pt Will Perform Lower Body Bathing: with min assist;with adaptive equipment;sit to/from stand Pt Will Perform Lower Body Dressing: with min assist;with adaptive equipment;sit to/from stand Pt Will Transfer to Toilet: with min assist;bedside commode;ambulating;grab bars Additional ADL Goal #1: Patient will independently verbalize and adhere to knee precautions  OT Frequency: Min 2X/week   Barriers to D/C: Decreased caregiver support          End of Session Equipment Utilized During Treatment: Rolling walker;Left knee immobilizer CPM Left Knee CPM Left Knee: Off  Activity Tolerance: Patient tolerated treatment well Patient left: in chair;with call bell/phone within reach;with family/visitor present   Time: 1139-1203 OT Time Calculation (min): 24 min Charges:  OT General Charges $OT Visit: 1 Procedure OT Evaluation $Initial OT Evaluation Tier I: 1 Procedure OT Treatments $Self Care/Home Management : 8-22 mins  Jamilette Suchocki , MS, OTR/L, CLT Pager: [phone]  01/13/2015, 12:12 PM

## 2015-01-13 NOTE — Clinical Social Work Placement (Signed)
Clinical Social Work Department CLINICAL SOCIAL WORK PLACEMENT NOTE 01/13/2015  Patient:  ARYANNA, SHAVER  Account Number:  [id number] Admit date:  01/12/2015  Clinical Social Worker:  Durward Fortes, CLINICAL SOCIAL WORKER  Date/time:  01/13/2015 10:19 AM  Clinical Social Work is seeking post-discharge placement for this patient at the following level of care:   [facility]   (*CSW will update this form in Epic as items are completed)   01/13/2015  Patient/family provided with [facility] Department of Clinical Social Work's list of facilities offering this level of care within the geographic area requested by the patient (or if unable, by the patient's family).  01/13/2015  Patient/family informed of their freedom to choose among providers that offer the needed level of care, that participate in Medicare, Medicaid or managed care program needed by the patient, have an available bed and are willing to accept the patient.  01/13/2015  Patient/family informed of [facility]' ownership interest in [facility], as well as of the fact that they are under no obligation to receive care at this facility.  PASARR submitted to EDS on 01/13/2015 PASARR number received on 01/13/2015  FL2 transmitted to all facilities in geographic area requested by pt/family on  01/13/2015 FL2 transmitted to all facilities within larger geographic area on   Patient informed that his/her managed care company has contracts with or will negotiate with  certain facilities, including the following:     Patient/family informed of bed offers received:  01/13/2015 Patient chooses bed at [facility] Physician recommends and patient chooses bed at    Patient to be transferred to [facility] on   Patient to be transferred to facility by  Patient and family notified of transfer on  Name of family member notified:    The following physician request were entered in Epic:   Additional  Comments:   Johnni Wunschel S. Annalynne Ibanez, BSW-Intern

## 2015-01-13 NOTE — Clinical Social Work Psychosocial (Signed)
Clinical Social Work Department BRIEF PSYCHOSOCIAL ASSESSMENT 01/13/2015  Patient:  Hannah Castillo, Hannah Castillo     Account Number:  [id number]     Admit date:  01/12/2015  Clinical Social Worker:  Durward Fortes, CLINICAL SOCIAL WORKER  Date/Time:  01/13/2015 10:11 AM  Referred by:  Physician  Date Referred:  01/13/2015 Referred for  SNF Placement   Other Referral:   none.   Interview type:  Patient Other interview type:   none.    PSYCHOSOCIAL DATA Living Status:  ALONE Admitted from facility:   Level of care:   Primary support name:  Scralette Banks Primary support relationship to patient:  CHILD, ADULT Degree of support available:   Adequate support.    CURRENT CONCERNS Current Concerns  Post-Acute Placement   Other Concerns:   none.    SOCIAL WORK ASSESSMENT / PLAN CSW and BSW-Intern consulted regarding possible SNF placement for pt once medically stable for discharge.    BSW-Intern met with pt at bedside after receiving report that pt wanted to be discharged to [facility] at time of discharge. Pt notified BSW-Intern that pt lives alone. Pt mentioned to BSW-Intern that when pt had previous surgery pt had a home health nurse and therapy that came out to administer pt's needs. Pt is currently not receving any services with home health, but is interested in receiving services if needed after time of discharge from [facility].    Pt is very involved in pt's care and is looking foward to the return back home with family and friends.    CSW and BSW-Intern to continue to assist with discharge planning needs.   Assessment/plan status:  Psychosocial Support/Ongoing Assessment of Needs Other assessment/ plan:   none.   Information/referral to community resources:   Pt to be discharged to [facility] once medically stable for discharge.    PATIENT'S/FAMILY'S RESPONSE TO PLAN OF CARE: Pt and pt's family agreeable and understanding of CSW plan of care. Pt expressed no further  questions or  concerns at this time.       Virgie Dad Correy Weidner, BSW-Intern

## 2015-01-13 NOTE — Care Management Note (Signed)
CARE MANAGEMENT NOTE 01/13/2015  Patient:  Hannah Castillo, Hannah Castillo   Account Number:  [id number]  Date Initiated:  01/13/2015  Documentation initiated by:  Ricki Miller  Subjective/Objective Assessment:   74 yr old female admitted with DJD of the left knee. Patient underwent a left total knee arthroplasty.     Action/Plan:   Patient will need shorterm rehab at [facility]. Plans to go to [facility]. Social Worker is aware.   Anticipated DC Date:  01/14/2015   Anticipated DC Plan:  SKILLED NURSING FACILITY  In-house referral  Clinical Social Worker      DC Planning Services  CM consult      [facility] Choice  NA   Choice offered to / List presented to:     DME arranged  NA        [place] arranged  NA      Status of service:  Completed, signed off Medicare Important Message given?  NA - LOS <3 / Initial given by admissions (If response is "NO", the following Medicare IM given date fields will be blank) Date Medicare IM given:   Medicare IM given by:   Date Additional Medicare IM given:   Additional Medicare IM given by:    Discharge Disposition:  [place]  Per UR Regulation:  Reviewed for med. necessity/level of care/duration of stay

## 2015-01-13 NOTE — Op Note (Signed)
NAME:  Hannah, Castillo NO.:  [id number]  MEDICAL RECORD NO.:  [id number]  LOCATION:                                 FACILITY:  PHYSICIAN:  Ninetta Lights, M.D. DATE OF BIRTH:  09/29/1941  DATE OF PROCEDURE:  01/12/2015 DATE OF DISCHARGE:                              OPERATIVE REPORT   PREOPERATIVE DIAGNOSIS:  Primary generalized end-stage arthritis, left knee.  POSTOPERATIVE DIAGNOSIS:  Primary generalized end-stage arthritis, left knee.  PROCEDURE:  Modified minimally invasive left total knee replacement Stryker triathlon prosthesis.  Cemented pegged posterior stabilized #3 femoral component.  Cemented #4 tibial component, 9 mm PS insert. Cemented resurfacing pegged 35 mm patellar component.  SURGEON:  Ninetta Lights, MD.  ASSISTANT:  Lynne Logan, PA was present throughout the entire case and necessary for timely completion of procedure.  ANESTHESIA:  General.  ESTIMATED BLOOD LOSS:  Minimal.  SPECIMENS:  None.  CULTURES:  None.  COMPLICATION:  None.  DRESSINGS:  Soft compressive knee immobilizer.  DRAINS:  Hemovac x1.  TOURNIQUET TIME:  55 minutes.  DESCRIPTION OF PROCEDURE:  The patient was brought to the operating room, placed on the operating table in supine position.  After adequate anesthesia had been obtained, tourniquet applied.  Prepped and draped in usual sterile fashion.  Exsanguinated with elevation of Esmarch. Tourniquet inflated to 350 mmHg.  Straight incision above the patella down to the tibial tubercle.  Skin and subcutaneous tissue divided. Medial arthrotomy, vastus splitting, preserving quad tendon.  Medial capsule release.  Intramedullary guide, distal femur.  An 8 mm resection, 5 degrees of valgus.  Using the epicondylar axis, the femur was sized, cut, and fitted for a pegged #3 posterior stabilized component.  Extramedullary guide on the tibia.  A 3-degree posterior slope cut.  Size #4 component.  Debris cleared   throughout.  Nicely balance of flexion and extension.  Patella exposed.  Posterior 10 mm removed.  Drilled, sized, and fitted for a 32-mm component.  Trials put in place.  With the 9 mm insert, I am very pleased to balancing, flexion, extension, stability, and patellar tracking.  Tibia was marked for rotation and hand reamed.  All trials removed.  Copious irrigation with pulse irrigating device.  Cement prepared, placed on all components, firmly seated.  Polyethylene attached to tibia, knee reduced.  Patella held with a clamp.  Once cement hardened, knee was irrigated again.  Soft tissues were injected with Exparel.  Hemovac placed.  Arthrotomy closed with Ethibond.  Subcutaneous and subcuticular closure, margins were injected with Marcaine.  Sterile compressive dressing applied.  Tourniquet deflated and removed.  Knee immobilizer applied.  Anesthesia reversed.  Brought to the recovery room.  Tolerated the surgery well.  No complications.     Ninetta Lights, M.D.     DFM/MEDQ  D:  01/12/2015  T:  01/13/2015  Job:  [id number]

## 2015-01-13 NOTE — Evaluation (Signed)
Physical Therapy Evaluation Patient Details Name: Hannah Castillo MRN: [id number] DOB: [birth year] Today's Date: 01/13/2015   History of Present Illness  Pt is a 74 y/o F s/p L TKA.  Pt's PMH includes R TKA, HTN, PONV, and RA.  Clinical Impression  Pt is s/p L TKA resulting in the deficits listed below (see PT Problem List).  Pt will benefit from skilled PT to increase their independence and safety with mobility to allow discharge to the venue listed below.     Follow Up Recommendations SNF;Supervision/Assistance - 24 hour    Equipment Recommendations  None recommended by PT (Pt has RW and BSC at home from prior R TKA)    Recommendations for Other Services       Precautions / Restrictions Precautions Precautions: Fall;Knee Precaution Booklet Issued: Yes (comment) Precaution Comments: No pillow under knee Required Braces or Orthoses: Knee Immobilizer - Left Knee Immobilizer - Left:  (Not specified in order) Restrictions Weight Bearing Restrictions: Yes LLE Weight Bearing: Weight bearing as tolerated      Mobility  Bed Mobility Overal bed mobility: Needs Assistance Bed Mobility: Supine to Sit     Supine to sit: Min assist;HOB elevated     General bed mobility comments: Min assist w/ bringing LLE OOB and verbal cues for proper sequencing.  Pt controlled her trunk extremely well w/ min use of railings for assistance.  Transfers Overall transfer level: Needs assistance Equipment used: Rolling walker (2 wheeled) Transfers: Sit to/from Stand Sit to Stand: Min assist         General transfer comment: assist w/ rising from bed.  Verbal and tactile cues for proper hand placement  Ambulation/Gait Ambulation/Gait assistance: Min guard Ambulation Distance (Feet): 50 Feet Assistive device: Rolling walker (2 wheeled) Gait Pattern/deviations: Step-to pattern;Decreased stride length;Decreased stance time - left;Shuffle;Antalgic;Trunk flexed   Gait velocity interpretation:  Below normal speed for age/gender General Gait Details: Shuffle of RLE initially but pt progressed to clearing feet B. Verbal cues to keep walker close to body.    Stairs            Wheelchair Mobility    Modified Rankin (Stroke Patients Only)       Balance Overall balance assessment: Needs assistance Sitting-balance support: Bilateral upper extremity supported;Feet supported Sitting balance-Leahy Scale: Fair     Standing balance support: Bilateral upper extremity supported;During functional activity Standing balance-Leahy Scale: Fair                               Pertinent Vitals/Pain Pain Assessment: 0-10 Pain Score: 8  Pain Location: L knee w/ mobility Pain Descriptors / Indicators: Aching;Grimacing;Heaviness;Pressure Pain Intervention(s): Limited activity within patient's tolerance;Monitored during session;Repositioned    Home Living Family/patient expects to be discharged to:: Skilled nursing facility Living Arrangements: Alone               Additional Comments: Pt lives in a 3 story house and says she plans to sleep on air mattress on 1st floor upon return home.  Pt has no stairs to enter but does not have asssitance at home.    Prior Function Level of Independence: Independent               Hand Dominance        Extremity/Trunk Assessment               Lower Extremity Assessment: LLE deficits/detail   LLE Deficits / Details: as expected  s/p L TKA  Cervical / Trunk Assessment: Normal  Communication   Communication: No difficulties  Cognition Arousal/Alertness: Lethargic;Awake/alert (Pt lethargic initially but began to wake up once moving) Behavior During Therapy: [facility] for tasks assessed/performed Overall Cognitive Status: Within Functional Limits for tasks assessed                      General Comments General comments (skin integrity, edema, etc.): Pt nauseous at start of session and requires sitting rest  break after ambulating 10 ft before completing 50 ft ambulating.    Exercises Total Joint Exercises Ankle Circles/Pumps: AROM;Both;10 reps;Supine Quad Sets: AROM;Both;10 reps;Supine Heel Slides: Left;5 reps;AAROM;Supine      Assessment/Plan    PT Assessment Patient needs continued PT services  PT Diagnosis Difficulty walking;Abnormality of gait;Generalized weakness;Acute pain   PT Problem List Decreased strength;Decreased range of motion;Decreased activity tolerance;Decreased mobility;Decreased balance;Decreased coordination;Decreased knowledge of use of DME;Decreased safety awareness;Decreased knowledge of precautions;Pain  PT Treatment Interventions DME instruction;Gait training;Stair training;Functional mobility training;Therapeutic activities;Therapeutic exercise;Balance training;Neuromuscular re-education;Patient/family education;Modalities   PT Goals (Current goals can be found in the Care Plan section) Acute Rehab PT Goals Patient Stated Goal: to walk PT Goal Formulation: With patient/family Time For Goal Achievement: 01/20/15 Potential to Achieve Goals: Good    Frequency 7X/week   Barriers to discharge Decreased caregiver support No assistance at home    Co-evaluation               End of Session Equipment Utilized During Treatment: Gait belt;Left knee immobilizer Activity Tolerance: Patient tolerated treatment well (pt limited by nausea) Patient left: in chair;with call bell/phone within reach;with family/visitor present Nurse Communication: Mobility status;Precautions;Weight bearing status         Time: 1962-2297 PT Time Calculation (min) (ACUTE ONLY): 43 min   Charges:   PT Evaluation $Initial PT Evaluation Tier I: 1 Procedure PT Treatments $Gait Training: 8-22 mins $Therapeutic Exercise: 8-22 mins   PT G Codes:       Joslyn Hy PT, DPT [phone] Pager: [phone]  01/13/2015, 9:52 AM

## 2015-01-13 NOTE — Progress Notes (Signed)
Utilization review completed.  

## 2015-01-13 NOTE — Discharge Instructions (Signed)
INSTRUCTIONS AFTER JOINT REPLACEMENT   o Remove items at home which could result in a fall. This includes throw rugs or furniture in walking pathways o ICE to the affected joint every three hours while awake for 30 minutes at a time, for at least the first 3-5 days, and then as needed for pain and swelling.  Continue to use ice for pain and swelling. You may notice swelling that will progress down to the foot and ankle.  This is normal after surgery.  Elevate your leg when you are not up walking on it.   o Continue to use the breathing machine you got in the hospital (incentive spirometer) which will help keep your temperature down.  It is common for your temperature to cycle up and down following surgery, especially at night when you are not up moving around and exerting yourself.  The breathing machine keeps your lungs expanded and your temperature down.  BLOOD THINNER: TAKE ELIQUIS AS DIRECTED FOR A TOTAL OF 12 DAYS FOLLOWING SURGERY TO PREVENT BLOOD CLOTS.  ONCE FINISHED WITH ELIQUIS PRESCRIPTION, YOU MAY RESUME ASPIRIN 81 MG.    DIET:  As you were doing prior to hospitalization, we recommend a well-balanced diet.  DRESSING / WOUND CARE / SHOWERING  You may change your dressing 3-5 days after surgery.  Then change the dressing every day with sterile gauze.  Please use good hand washing techniques before changing the dressing.  Do not use any lotions or creams on the incision until instructed by your surgeon. and You may shower 3 days after surgery, but keep the wounds dry during showering.  You may use an occlusive plastic wrap (Press'n Seal for example), NO SOAKING/SUBMERGING IN THE BATHTUB.  If the bandage gets wet, change with a clean dry gauze.  If the incision gets wet, pat the wound dry with a clean towel.  ACTIVITY  o Increase activity slowly as tolerated, but follow the weight bearing instructions below.   o No driving for 6 weeks or until further direction given by your physician.  You  cannot drive while taking narcotics.  o No lifting or carrying greater than 10 lbs. until further directed by your surgeon. o Avoid periods of inactivity such as sitting longer than an hour when not asleep. This helps prevent blood clots.  o You may return to work once you are authorized by your doctor.     WEIGHT BEARING   Weight bearing as tolerated with assist device (walker, cane, etc) as directed, use it as long as suggested by your surgeon or therapist, typically at least 4-6 weeks.   EXERCISES  Results after joint replacement surgery are often greatly improved when you follow the exercise, range of motion and muscle strengthening exercises prescribed by your doctor. Safety measures are also important to protect the joint from further injury. Any time any of these exercises cause you to have increased pain or swelling, decrease what you are doing until you are comfortable again and then slowly increase them. If you have problems or questions, call your caregiver or physical therapist for advice.   Rehabilitation is important following a joint replacement. After just a few days of immobilization, the muscles of the leg can become weakened and shrink (atrophy).  These exercises are designed to build up the tone and strength of the thigh and leg muscles and to improve motion. Often times heat used for twenty to thirty minutes before working out will loosen up your tissues and help with improving  the range of motion but do not use heat for the first two weeks following surgery (sometimes heat can increase post-operative swelling).   These exercises can be done on a training (exercise) mat, on the floor, on a table or on a bed. Use whatever works the best and is most comfortable for you.    Use music or television while you are exercising so that the exercises are a pleasant break in your day. This will make your life better with the exercises acting as a break in your routine that you can look  forward to.   Perform all exercises about fifteen times, three times per day or as directed.  You should exercise both the operative leg and the other leg as well.  Exercises include:    Quad Sets - Tighten up the muscle on the front of the thigh (Quad) and hold for 5-10 seconds.    Straight Leg Raises - With your knee straight (if you were given a brace, keep it on), lift the leg to 60 degrees, hold for 3 seconds, and slowly lower the leg.  Perform this exercise against resistance later as your leg gets stronger.   Leg Slides: Lying on your back, slowly slide your foot toward your buttocks, bending your knee up off the floor (only go as far as is comfortable). Then slowly slide your foot back down until your leg is flat on the floor again.   Angel Wings: Lying on your back spread your legs to the side as far apart as you can without causing discomfort.   Hamstring Strength:  Lying on your back, push your heel against the floor with your leg straight by tightening up the muscles of your buttocks.  Repeat, but this time bend your knee to a comfortable angle, and push your heel against the floor.  You may put a pillow under the heel to make it more comfortable if necessary.   A rehabilitation program following joint replacement surgery can speed recovery and prevent re-injury in the future due to weakened muscles. Contact your doctor or a physical therapist for more information on knee rehabilitation.    CONSTIPATION  Constipation is defined medically as fewer than three stools per week and severe constipation as less than one stool per week.  Even if you have a regular bowel pattern at home, your normal regimen is likely to be disrupted due to multiple reasons following surgery.  Combination of anesthesia, postoperative narcotics, change in appetite and fluid intake all can affect your bowels.   YOU MUST use at least one of the following options; they are listed in order of increasing strength  to get the job done.  They are all available over the counter, and you may need to use some, POSSIBLY even all of these options:    Drink plenty of fluids (prune juice may be helpful) and high fiber foods Colace 100 mg by mouth twice a day  Senokot for constipation as directed and as needed Dulcolax (bisacodyl), take with full glass of water  Miralax (polyethylene glycol) once or twice a day as needed.  If you have tried all these things and are unable to have a bowel movement in the first 3-4 days after surgery call either your surgeon or your primary doctor.    If you experience loose stools or diarrhea, hold the medications until you stool forms back up.  If your symptoms do not get better within 1 week or if they get worse, check  with your doctor.  If you experience "the worst abdominal pain ever" or develop nausea or vomiting, please contact the office immediately for further recommendations for treatment.   ITCHING:  If you experience itching with your medications, try taking only a single pain pill, or even half a pain pill at a time.  You can also use Benadryl over the counter for itching or also to help with sleep.   TED HOSE STOCKINGS:  Use stockings on both legs until for at least 2 weeks or as directed by physician office. They may be removed at night for sleeping.  MEDICATIONS:  See your medication summary on the After Visit Summary that nursing will review with you.  You may have some home medications which will be placed on hold until you complete the course of blood thinner medication.  It is important for you to complete the blood thinner medication as prescribed.  PRECAUTIONS:  If you experience chest pain or shortness of breath - call 911 immediately for transfer to the hospital emergency department.   If you develop a fever greater that 101 F, purulent drainage from wound, increased redness or drainage from wound, foul odor from the wound/dressing, or calf pain - CONTACT  YOUR SURGEON.                                                   FOLLOW-UP APPOINTMENTS:  If you do not already have a post-op appointment, please call the office for an appointment to be seen by your surgeon.  Guidelines for how soon to be seen are listed in your After Visit Summary, but are typically between 1-4 weeks after surgery.  OTHER INSTRUCTIONS:   Knee Replacement:  Do not place pillow under knee, focus on keeping the knee straight while resting. CPM instructions: 0-90 degrees, 2 hours in the morning, 2 hours in the afternoon, and 2 hours in the evening. Place foam block, curve side up under heel at all times except when in CPM or when walking.  DO NOT modify, tear, cut, or change the foam block in any way.  MAKE SURE YOU:   Understand these instructions.   Get help right away if you are not doing well or get worse.    Thank you for letting us be a part of your medical care team.  It is a privilege we respect greatly.  We hope these instructions will help you stay on track for a fast and full recovery!  Information on my medicine - ELIQUIS (apixaban)  This medication education was reviewed with me or my healthcare representative as part of my discharge preparation.  The pharmacist that spoke with me during my hospital stay was:  Eudelia Bunch, [facility]  Why was Eliquis prescribed for you? Eliquis was prescribed for you to reduce the risk of blood clots forming after orthopedic surgery.    What do You need to know about Eliquis? Take your Eliquis TWICE DAILY - one tablet in the morning and one tablet in the evening with or without food.  It would be best to take the dose about the same time each day.  If you have difficulty swallowing the tablet whole please discuss with your pharmacist how to take the medication safely.  Take Eliquis exactly as prescribed by your doctor and DO NOT stop taking Eliquis without talking  to the doctor who prescribed the medication.  Stopping  without other medication to take the place of Eliquis may increase your risk of developing a clot.  After discharge, you should have regular check-up appointments with your healthcare provider that is prescribing your Eliquis.  What do you do if you miss a dose? If a dose of ELIQUIS is not taken at the scheduled time, take it as soon as possible on the same day and twice-daily administration should be resumed.  The dose should not be doubled to make up for a missed dose.  Do not take more than one tablet of ELIQUIS at the same time.  Important Safety Information A possible side effect of Eliquis is bleeding. You should call your healthcare provider right away if you experience any of the following: ? Bleeding from an injury or your nose that does not stop. ? Unusual colored urine (red or dark brown) or unusual colored stools (red or black). ? Unusual bruising for unknown reasons. ? A serious fall or if you hit your head (even if there is no bleeding).  Some medicines may interact with Eliquis and might increase your risk of bleeding or clotting while on Eliquis. To help avoid this, consult your healthcare provider or pharmacist prior to using any new prescription or non-prescription medications, including herbals, vitamins, non-steroidal anti-inflammatory drugs (NSAIDs) and supplements.  This website has more information on Eliquis (apixaban): http://www.eliquis.com/eliquis/homeInformation on my medicine - ELIQUIS (apixaban)  This medication education was reviewed with me or my healthcare representative as part of my discharge preparation.  The pharmacist that spoke with me during my hospital stay was:  Eudelia Bunch, [facility]  Why was Eliquis prescribed for you? Eliquis was prescribed for you to reduce the risk of blood clots forming after orthopedic surgery.    What do You need to know about Eliquis? Take your Eliquis TWICE DAILY - one tablet in the morning and one tablet in the  evening with or without food.  It would be best to take the dose about the same time each day.  If you have difficulty swallowing the tablet whole please discuss with your pharmacist how to take the medication safely.  Take Eliquis exactly as prescribed by your doctor and DO NOT stop taking Eliquis without talking to the doctor who prescribed the medication.  Stopping without other medication to take the place of Eliquis may increase your risk of developing a clot.  After discharge, you should have regular check-up appointments with your healthcare provider that is prescribing your Eliquis.  What do you do if you miss a dose? If a dose of ELIQUIS is not taken at the scheduled time, take it as soon as possible on the same day and twice-daily administration should be resumed.  The dose should not be doubled to make up for a missed dose.  Do not take more than one tablet of ELIQUIS at the same time.  Important Safety Information A possible side effect of Eliquis is bleeding. You should call your healthcare provider right away if you experience any of the following: ? Bleeding from an injury or your nose that does not stop. ? Unusual colored urine (red or dark brown) or unusual colored stools (red or black). ? Unusual bruising for unknown reasons. ? A serious fall or if you hit your head (even if there is no bleeding).  Some medicines may interact with Eliquis and might increase your risk of bleeding or clotting while on Eliquis. To help  healthcare provider or pharmacist prior to using any new prescription or non-prescription medications, including herbals, vitamins, non-steroidal anti-inflammatory drugs (NSAIDs) and supplements.  This website has more information on Eliquis (apixaban): http://www.eliquis.com/eliquis/home 

## 2015-01-13 NOTE — Progress Notes (Signed)
Subjective: 1 Day Post-Op Procedure(s) (LRB): TOTAL Castillo ARTHROPLASTY (Left) Patient reports pain as mild.  Patient reports lightheadedness when sitting on edge of bed.  Otherwise, no nausea/vomiting, chest pain/sob.  Negative flatus/bm.  Good appetite and tolerating diet.  Objective: Vital signs in last 24 hours: Temp:  [97.5 F (36.4 C)-98.7 F (37.1 C)] 98.7 F (37.1 C) (04/14 0447) Pulse Rate:  [58-86] 74 (04/14 0447) Resp:  [8-20] 16 (04/14 0447) BP: (92-152)/(42-78) 107/48 mmHg (04/14 0447) SpO2:  [99 %-100 %] 100 % (04/14 0447) Weight:  [87.091 kg (192 lb)] 87.091 kg (192 lb) (04/13 1204)  Intake/Output from previous day: 04/13 0701 - 04/14 0700 In: 9628 [P.O.:240; I.V.:1100] Out: 1475 [Urine:975; Drains:475; Blood:25] Intake/Output this shift: Total I/O In: -  Out: 1050 [Urine:825; Drains:225]   Recent Labs  01/13/15 0530  HGB 10.1*    Recent Labs  01/13/15 0530  WBC 6.7  RBC 3.39*  HCT 30.5*  PLT 240   No results for input(s): NA, K, CL, CO2, BUN, CREATININE, GLUCOSE, CALCIUM in the last 72 hours. No results for input(s): LABPT, INR in the last 72 hours.  Neurologically intact Neurovascular intact Sensation intact distally Intact pulses distally Dorsiflexion/Plantar flexion intact Compartment soft  Negative homans bilaterally hemovac drain pulled by me today  Assessment/Plan: 1 Day Post-Op Procedure(s) (LRB): TOTAL Castillo ARTHROPLASTY (Left) Advance diet Up with therapy Plan for discharge tomorrow to SNF WBAT LLE ABLA-mild and stable Dry dressing change prn  Hannah Castillo 01/13/2015, 6:41 AM

## 2015-01-14 DIAGNOSIS — K5901 Slow transit constipation: Secondary | ICD-10-CM | POA: Diagnosis not present

## 2015-01-14 DIAGNOSIS — D62 Acute posthemorrhagic anemia: Secondary | ICD-10-CM | POA: Diagnosis not present

## 2015-01-14 DIAGNOSIS — E039 Hypothyroidism, unspecified: Secondary | ICD-10-CM | POA: Diagnosis not present

## 2015-01-14 DIAGNOSIS — M25562 Pain in left knee: Secondary | ICD-10-CM | POA: Diagnosis not present

## 2015-01-14 DIAGNOSIS — I10 Essential (primary) hypertension: Secondary | ICD-10-CM | POA: Diagnosis not present

## 2015-01-14 DIAGNOSIS — R2689 Other abnormalities of gait and mobility: Secondary | ICD-10-CM | POA: Diagnosis not present

## 2015-01-14 DIAGNOSIS — M1712 Unilateral primary osteoarthritis, left knee: Secondary | ICD-10-CM | POA: Diagnosis not present

## 2015-01-14 DIAGNOSIS — E785 Hyperlipidemia, unspecified: Secondary | ICD-10-CM | POA: Diagnosis not present

## 2015-01-14 DIAGNOSIS — Z96652 Presence of left artificial knee joint: Secondary | ICD-10-CM | POA: Diagnosis not present

## 2015-01-14 DIAGNOSIS — D72829 Elevated white blood cell count, unspecified: Secondary | ICD-10-CM | POA: Diagnosis not present

## 2015-01-14 DIAGNOSIS — Z471 Aftercare following joint replacement surgery: Secondary | ICD-10-CM | POA: Diagnosis not present

## 2015-01-14 DIAGNOSIS — R278 Other lack of coordination: Secondary | ICD-10-CM | POA: Diagnosis not present

## 2015-01-14 LAB — BASIC METABOLIC PANEL
ANION GAP: 10 (ref 5–15)
BUN: 17 mg/dL (ref 6–23)
CALCIUM: 9.6 mg/dL (ref 8.4–10.5)
CHLORIDE: 102 mmol/L (ref 96–112)
CO2: 24 mmol/L (ref 19–32)
Creatinine, Ser: 0.99 mg/dL (ref 0.50–1.10)
GFR calc Af Amer: 63 mL/min — ABNORMAL LOW (ref >90)
GFR calc non Af Amer: 54 mL/min — ABNORMAL LOW (ref >90)
GLUCOSE: 116 mg/dL — AB (ref 70–99)
Potassium: 4 mmol/L (ref 3.5–5.1)
SODIUM: 136 mmol/L (ref 135–145)

## 2015-01-14 LAB — CBC
HEMATOCRIT: 28.4 % — AB (ref 36.0–46.0)
Hemoglobin: 9.6 g/dL — ABNORMAL LOW (ref 12.0–15.0)
MCH: 30.5 pg (ref 26.0–34.0)
MCHC: 33.8 g/dL (ref 30.0–36.0)
MCV: 90.2 fL (ref 78.0–100.0)
Platelets: 217 10*3/uL (ref 150–400)
RBC: 3.15 MIL/uL — ABNORMAL LOW (ref 3.87–5.11)
RDW: 13.2 % (ref 11.5–15.5)
WBC: 13 10*3/uL — ABNORMAL HIGH (ref 4.0–10.5)

## 2015-01-14 NOTE — Progress Notes (Signed)
Report given to [facility], all questions were answered.  

## 2015-01-14 NOTE — Progress Notes (Signed)
Physical Therapy Treatment Patient Details Name: Hannah Castillo MRN: [id number] DOB: Jun 19, 1941 Today's Date: 01/14/2015    History of Present Illness Pt is a 74 y/o F s/p L TKA.  Pt's PMH includes R TKA, HTN, PONV, and RA.    PT Comments    Patient is making good progress with PT.  From a mobility standpoint anticipate patient will be ready for DC to SNF today as anticipated.     Follow Up Recommendations  SNF;Supervision/Assistance - 24 hour     Equipment Recommendations  None recommended by PT    Recommendations for Other Services       Precautions / Restrictions Precautions Precautions: Fall;Knee Precaution Comments: Reviewed no pillow under knee Restrictions Weight Bearing Restrictions: Yes LLE Weight Bearing: Weight bearing as tolerated    Mobility  Bed Mobility Overal bed mobility: Needs Assistance Bed Mobility: Supine to Sit     Supine to sit: Supervision;HOB elevated     General bed mobility comments: HOB elevated, increased time  Transfers Overall transfer level: Needs assistance Equipment used: Rolling walker (2 wheeled) Transfers: Sit to/from Stand Sit to Stand: Min assist         General transfer comment: min assist trunk anteriorly during sit>stand.  Pt demonstrated fantastic control during stand>sit  Ambulation/Gait Ambulation/Gait assistance: Min guard Ambulation Distance (Feet): 80 Feet Assistive device: Rolling walker (2 wheeled) Gait Pattern/deviations: Step-to pattern;Step-through pattern;Decreased stride length;Decreased stance time - left;Antalgic;Trunk flexed   Gait velocity interpretation: <1.8 ft/sec, indicative of risk for recurrent falls General Gait Details: Pt able to demonstrate heel strike and toe off w/ veral cues.  Gait speed decreased.   Stairs            Wheelchair Mobility    Modified Rankin (Stroke Patients Only)       Balance Overall balance assessment: Needs assistance Sitting-balance support:  Feet supported;No upper extremity supported Sitting balance-Leahy Scale: Fair     Standing balance support: Bilateral upper extremity supported;During functional activity Standing balance-Leahy Scale: Fair                      Cognition Arousal/Alertness: Awake/alert Behavior During Therapy: WFL for tasks assessed/performed Overall Cognitive Status: Within Functional Limits for tasks assessed                      Exercises Total Joint Exercises Short Arc Quad: AROM;Left;10 reps;Supine Heel Slides: AROM;Left;10 reps;Supine Knee Flexion: AROM;Left;5 reps;Seated Goniometric ROM: 5-88    General Comments        Pertinent Vitals/Pain Pain Assessment: 0-10 Pain Score: 5  Pain Location: L knee Pain Descriptors / Indicators: Aching;Discomfort;Guarding (w/ mobility) Pain Intervention(s): Limited activity within patient's tolerance;Monitored during session;Repositioned    Home Living                      Prior Function            PT Goals (current goals can now be found in the care plan section) Acute Rehab PT Goals Patient Stated Goal: get stronger PT Goal Formulation: With patient/family Time For Goal Achievement: 01/20/15 Potential to Achieve Goals: Good Progress towards PT goals: Progressing toward goals    Frequency  7X/week    PT Plan Current plan remains appropriate    Co-evaluation             End of Session Equipment Utilized During Treatment: Gait belt Activity Tolerance: Patient tolerated treatment well Patient left: in chair;with  call bell/phone within reach;with family/visitor present     Time: 6468-0321 PT Time Calculation (min) (ACUTE ONLY): 19 min  Charges:  $Gait Training: 8-22 mins                    G Codes:      Joslyn Hy PT, [place] [phone] Pager: [phone]  01/14/2015, 12:17 PM

## 2015-01-14 NOTE — Discharge Planning (Signed)
Patient to be discharged to [facility]. Patient updated at bedside.  Facility: Contractor to call report: [phone] Transportation: EMS ([facility])  Lubertha Sayres, [place] Cell: [phone]       Fax: [phone] Clinical Social Work: Orthopedics [phone]) and Surgical [phone])

## 2015-01-14 NOTE — Progress Notes (Signed)
Subjective: 2 Days Post-Op Procedure(s) (LRB): TOTAL KNEE ARTHROPLASTY (Left) Patient reports pain as mild.  No nausea/vomiting, lightheadedness/dizziness.  Good appetite and tolerating diet.  Objective: Vital signs in last 24 hours: Temp:  [98.2 F (36.8 C)-98.9 F (37.2 C)] 98.2 F (36.8 C) (04/15 0641) Pulse Rate:  [70-78] 78 (04/15 0641) Resp:  [17-18] 17 (04/15 0400) BP: (111-120)/(50-57) 116/50 mmHg (04/15 0641) SpO2:  [97 %-100 %] 98 % (04/15 0641)  Intake/Output from previous day: 04/14 0701 - 04/15 0700 In: 2560 [P.O.:120; I.V.:2440] Out: -  Intake/Output this shift:     Recent Labs  01/13/15 0530 01/14/15 0543  HGB 10.1* 9.6*    Recent Labs  01/13/15 0530 01/14/15 0543  WBC 6.7 13.0*  RBC 3.39* 3.15*  HCT 30.5* 28.4*  PLT 240 217    Recent Labs  01/13/15 0530 01/14/15 0543  NA 138 136  K 3.6 4.0  CL 102 102  CO2 28 24  BUN 11 17  CREATININE 0.89 0.99  GLUCOSE 118* 116*  CALCIUM 9.0 9.6   No results for input(s): LABPT, INR in the last 72 hours.  Neurologically intact Neurovascular intact Sensation intact distally Intact pulses distally Dorsiflexion/Plantar flexion intact Incision: dressing C/D/I No cellulitis present Compartment soft  Dressing changed by me today Negative homans bilaterally  Assessment/Plan: 2 Days Post-Op Procedure(s) (LRB): TOTAL KNEE ARTHROPLASTY (Left) Advance diet Up with therapy D/C IV fluids Discharge to SNF today WBAT LLE ABLA-mild and stable Dry dressing change prn   Fannie Knee 01/14/2015, 7:44 AM

## 2015-01-14 NOTE — Clinical Social Work Placement (Signed)
Clinical Social Work Department CLINICAL SOCIAL WORK PLACEMENT NOTE 01/13/2015  Patient:  Hannah Castillo, Hannah Castillo  Account Number:  [id number] Admit date:  01/12/2015  Clinical Social Worker:  Durward Fortes, CLINICAL SOCIAL WORKER  Date/time:  01/13/2015 10:19 AM  Clinical Social Work is seeking post-discharge placement for this patient at the following level of care:   [place]   (*CSW will update this form in Epic as items are completed)   01/13/2015  Patient/family provided with [facility] Department of Clinical Social Work's list of facilities offering this level of care within the geographic area requested by the patient (or if unable, by the patient's family).  01/13/2015  Patient/family informed of their freedom to choose among providers that offer the needed level of care, that participate in Medicare, Medicaid or managed care program needed by the patient, have an available bed and are willing to accept the patient.  01/13/2015  Patient/family informed of [facility]' ownership interest in [facility], as well as of the fact that they are under no obligation to receive care at this facility.  PASARR submitted to EDS on 01/13/2015 PASARR number received on 01/13/2015  FL2 transmitted to all facilities in geographic area requested by pt/family on  01/13/2015 FL2 transmitted to all facilities within larger geographic area on   Patient informed that his/her managed care company has contracts with or will negotiate with  certain facilities, including the following:     Patient/family informed of bed offers received:  01/13/2015 Patient chooses bed at [place] Physician recommends and patient chooses bed at  [facility]  Patient to be transferred to [place] on 01/14/2015  Patient to be transferred to facility by [facility] Patient and family notified of transfer on 01/14/2015 Name of family member notified:  Patient updated at bedside.  The following physician  request were entered in Epic:   Additional Comments:   Kierra S. Wiley, BSW-Intern   [place] Cell: [phone]       Fax: [phone] Clinical Social Work: Orthopedics [phone]) and Surgical [phone])

## 2015-01-14 NOTE — Plan of Care (Signed)
Problem: Consults Goal: Diagnosis- Total Joint Replacement Outcome: Completed/Met Date Met:  01/14/15 Primary Total Knee left

## 2015-01-17 ENCOUNTER — Encounter (HOSPITAL_COMMUNITY): Payer: Self-pay | Admitting: Orthopedic Surgery

## 2015-01-18 ENCOUNTER — Non-Acute Institutional Stay (SKILLED_NURSING_FACILITY): Payer: Medicare Other | Admitting: Internal Medicine

## 2015-01-18 DIAGNOSIS — D62 Acute posthemorrhagic anemia: Secondary | ICD-10-CM

## 2015-01-18 DIAGNOSIS — K5901 Slow transit constipation: Secondary | ICD-10-CM | POA: Diagnosis not present

## 2015-01-18 DIAGNOSIS — E785 Hyperlipidemia, unspecified: Secondary | ICD-10-CM | POA: Diagnosis not present

## 2015-01-18 DIAGNOSIS — I10 Essential (primary) hypertension: Secondary | ICD-10-CM

## 2015-01-18 DIAGNOSIS — D72829 Elevated white blood cell count, unspecified: Secondary | ICD-10-CM

## 2015-01-18 DIAGNOSIS — M1712 Unilateral primary osteoarthritis, left knee: Secondary | ICD-10-CM

## 2015-01-18 DIAGNOSIS — E039 Hypothyroidism, unspecified: Secondary | ICD-10-CM | POA: Diagnosis not present

## 2015-01-18 NOTE — Progress Notes (Signed)
Patient ID: Hannah Castillo, female   DOB: 1941/01/06, 74 y.o.   MRN: [id number]     [facility]   PCP: Maximino Greenland, MD  Code Status: full code  Allergies  Allergen Reactions  . Hydrocodone Nausea Only    MAKES PT FEEL VERY "SICK"    Chief Complaint  Patient presents with  . New Admit To SNF     HPI:  74 year old patient is here for short term rehabilitation post hospital admission from 01/12/15-01/14/15 with left knee arthritis. She underwent left total knee arthroplasty. She is seen in her room today. Denies any concerns today. Her daughter is present in the room  Review of Systems:  Constitutional: Negative for fever, chills, diaphoresis. positive for fatigue. HENT: Negative for headache, congestion Eyes: Negative for eye pain, blurred vision, double vision and discharge.  Respiratory: Negative for cough, shortness of breath and wheezing.   Cardiovascular: Negative for chest pain, palpitations, leg swelling.  Gastrointestinal: Negative for heartburn, nausea, vomiting, abdominal pain Genitourinary: Negative for dysuria Musculoskeletal: Negative for back pain, falls Skin: Negative for itching, rash.  Neurological: Negative for dizziness, tingling, focal weakness Psychiatric/Behavioral: Negative for depression   Past Medical History  Diagnosis Date  . PONV (postoperative nausea and vomiting)   . Hypertension     takes Lisinopril-HCTZ daily  . Hypothyroidism     takes Synthroid daily  . Hyperlipidemia     takes Zetia and Atorvastatin daily  . History of bronchitis     "a long time ago"  . Joint pain   . Rheumatoid arthritis(714.0)     takes Plaquenil daily  . Degenerative joint disease   . Diabetes mellitus without complication     "prediabetic"   Past Surgical History  Procedure Laterality Date  . Thyroidectomy, partial  at age 12  . Cataract extraction Bilateral   . Total knee arthroplasty  10/17/2011    Procedure:  TOTAL KNEE ARTHROPLASTY;  Surgeon: Ninetta Lights, MD;  Location: [facility];  Service: Orthopedics;  Laterality: Right;  120 MINUTES FOR THIS SURGERY  . Appendectomy  60's  . Abdominal hysterectomy  1984  . Colonoscopy    . Total knee arthroplasty Left 01/12/2015  . Total knee arthroplasty Left 01/12/2015    Procedure: TOTAL KNEE ARTHROPLASTY;  Surgeon: Kathryne Hitch, MD;  Location: [facility];  Service: Orthopedics;  Laterality: Left;   Social History:   reports that she has quit smoking. She has never used smokeless tobacco. She reports that she does not drink alcohol or use illicit drugs.  Family History  Problem Relation Age of Onset  . Anesthesia problems Neg Hx     Medications: Patient's Medications  New Prescriptions   No medications on file  Previous Medications   ASCORBIC ACID (VITAMIN C PO)    Take 1 tablet by mouth daily.   ASPIRIN 81 MG TABLET    Take 81 mg by mouth daily. Po qd (start 01-24-15)   ATORVASTATIN (LIPITOR) 40 MG TABLET    Take 40 mg by mouth daily.     BISACODYL (DULCOLAX) 5 MG EC TABLET    Take 1 tablet (5 mg total) by mouth daily as needed for moderate constipation.   CHOLECALCIFEROL (VITAMIN D3) 50000 UNITS CAPS    Take 50,000 Units by mouth once a week.   DOCUSATE SODIUM (COLACE) 100 MG CAPSULE    Take 100 mg by mouth 2 (two) times daily.   ELIQUIS 2.5 MG TABS TABLET  Take 2.5 mg by mouth 2 (two) times daily.   EZETIMIBE (ZETIA) 10 MG TABLET    Take 10 mg by mouth daily.   HYDROXYCHLOROQUINE (PLAQUENIL) 200 MG TABLET    Take 200 mg by mouth daily.   LEVOTHYROXINE (SYNTHROID, LEVOTHROID) 150 MCG TABLET    Take 150 mcg by mouth daily.     LISINOPRIL-HYDROCHLOROTHIAZIDE (PRINZIDE,ZESTORETIC) 10-12.5 MG PER TABLET    Take 1 tablet by mouth daily.     METHOCARBAMOL (ROBAXIN) 500 MG TABLET    Take 1 tablet (500 mg total) by mouth 4 (four) times daily.   ONDANSETRON (ZOFRAN) 4 MG TABLET    Take 1 tablet (4 mg total) by mouth every 8 (eight) hours as needed for nausea  or vomiting.   OXYCODONE-ACETAMINOPHEN (ROXICET) 5-325 MG PER TABLET    Take 1-2 tablets by mouth every 4 (four) hours as needed.   POLYETHYLENE GLYCOL POWDER (GLYCOLAX/MIRALAX) POWDER    Take by mouth. One capful with 4-8 oz of fluid bid prn  Modified Medications   No medications on file  Discontinued Medications   No medications on file     Physical Exam: Filed Vitals:   01/18/15 1633  BP: 118/88  Pulse: 88  Temp: 98.3 F (36.8 C)  Resp: 18  SpO2: 99%    General- elderly female, well built, in no acute distress Head- normocephalic, atraumatic Throat- moist mucus membrane Neck- no cervical lymphadenopathy Cardiovascular- normal s1,s2, no murmurs, palpable dorsalis pedis and radial pulses, trace leg edema Respiratory- bilateral clear to auscultation, no wheeze, no rhonchi, no crackles, no use of accessory muscles Abdomen- bowel sounds present, soft, non tender Musculoskeletal- able to move all 4 extremities,limited left knee range of motion  Neurological- no focal deficit Skin- warm and dry. aquacel dressing to left knee. Ted hose in place Psychiatry- alert and oriented to person, place and time, normal mood and affect    Labs reviewed: Basic Metabolic Panel:  Recent Labs  12/30/14 1328 01/13/15 0530 01/14/15 0543  NA 140 138 136  K 3.7 3.6 4.0  CL 103 102 102  CO2 30 28 24   GLUCOSE 85 118* 116*  BUN 14 11 17   CREATININE 1.04 0.89 0.99  CALCIUM 10.2 9.0 9.6   Liver Function Tests:  Recent Labs  12/30/14 1328  AST 26  ALT 24  ALKPHOS 143*  BILITOT 0.8  PROT 7.0  ALBUMIN 3.9   No results for input(s): LIPASE, AMYLASE in the last 8760 hours. No results for input(s): AMMONIA in the last 8760 hours. CBC:  Recent Labs  12/30/14 1328 01/13/15 0530 01/14/15 0543  WBC 6.2 6.7 13.0*  NEUTROABS 2.9  --   --   HGB 13.0 10.1* 9.6*  HCT 39.3 30.5* 28.4*  MCV 90.1 90.0 90.2  PLT 278 240 217   CBG:  Recent Labs  01/12/15 1210 01/12/15 1451  GLUCAP  82 104*    Assessment/Plan  Left knee OA S/p left TKA. Will have patient work with PT/OT as tolerated to regain strength and restore function.  Fall precautions are in place. Continue eliquis 2.5 mg bid for dvt prophylaxis. Has f/u with orthopedics. Continue roxicet 5-325 1-2 tab q4 h prn for pain and robaxin for muscle spasm  Constipation Stable, continue colace 100 mg bid, prn dulcolax and prn miralax for now  Blood loss anemia Post op, monitor h&h  Leukocytosis No signs of infection,likely a stress response, monitor cbc with diff  HLD Continue lipitor 40 mg daily  Hypothyroidism Continue  levothyroxine 150 mcg daily  HTN bp stable, continue lisinopril-hctz 10-12.5 daily for now   Goals of care: short term rehabilitation   Labs/tests ordered: cbc  Family/ staff Communication: reviewed care plan with patient and nursing supervisor    Blanchie Serve, MD  [place] [phone] (Monday-Friday 8 am - 5 pm) [phone] (afterhours)

## 2015-01-20 ENCOUNTER — Non-Acute Institutional Stay (SKILLED_NURSING_FACILITY): Payer: Medicare Other | Admitting: Adult Health

## 2015-01-20 ENCOUNTER — Encounter: Payer: Self-pay | Admitting: Adult Health

## 2015-01-20 DIAGNOSIS — E039 Hypothyroidism, unspecified: Secondary | ICD-10-CM | POA: Diagnosis not present

## 2015-01-20 DIAGNOSIS — I10 Essential (primary) hypertension: Secondary | ICD-10-CM | POA: Diagnosis not present

## 2015-01-20 DIAGNOSIS — D72829 Elevated white blood cell count, unspecified: Secondary | ICD-10-CM

## 2015-01-20 DIAGNOSIS — E785 Hyperlipidemia, unspecified: Secondary | ICD-10-CM | POA: Diagnosis not present

## 2015-01-20 DIAGNOSIS — D62 Acute posthemorrhagic anemia: Secondary | ICD-10-CM

## 2015-01-20 DIAGNOSIS — M1712 Unilateral primary osteoarthritis, left knee: Secondary | ICD-10-CM | POA: Diagnosis not present

## 2015-01-20 DIAGNOSIS — K5901 Slow transit constipation: Secondary | ICD-10-CM | POA: Diagnosis not present

## 2015-01-20 NOTE — Progress Notes (Signed)
Patient ID: Hannah Castillo, female   DOB: 1941-06-18, 74 y.o.   MRN: [id number]   01/20/2015  Facility:  [facility] Location:  [place] Room Number: 807-P LEVEL OF CARE:  SNF (31)   Chief Complaint  Patient presents with  . Discharge Note    Osteoarthritis S/P left total knee arthroplasty, anemia, constipation, hypothyroidism, hyperlipidemia, leukocytosis and hypertension    HISTORY OF PRESENT ILLNESS:  This is a 74 year old female who is for discharge home with home health PT for endurance and OT for ADLs. She has been admitted to [facility] on 01/14/15 from [facility] with osteoarthritis S/P left total knee arthroplasty. She has PMH of hypertension, hypothyroidism, hyperlipidemia and  rheumatoid arthritis.  Patient was admitted to this facility for short-term rehabilitation after the patient's recent hospitalization.  Patient has completed SNF rehabilitation and therapy has cleared the patient for discharge.  PAST MEDICAL HISTORY:  Past Medical History  Diagnosis Date  . PONV (postoperative nausea and vomiting)   . Hypertension     takes Lisinopril-HCTZ daily  . Hypothyroidism     takes Synthroid daily  . Hyperlipidemia     takes Zetia and Atorvastatin daily  . History of bronchitis     "a long time ago"  . Joint pain   . Rheumatoid arthritis(714.0)     takes Plaquenil daily  . Degenerative joint disease   . Diabetes mellitus without complication     "prediabetic"    CURRENT MEDICATIONS: Reviewed per MAR/see medication list  Allergies  Allergen Reactions  . Hydrocodone Nausea Only    MAKES PT FEEL VERY "SICK"     REVIEW OF SYSTEMS:  GENERAL: no change in appetite, no fatigue, no weight changes, no fever, chills or weakness RESPIRATORY: no cough, SOB, DOE, wheezing, hemoptysis CARDIAC: no chest pain, edema or palpitations GI: no abdominal pain, diarrhea, constipation, heart burn, nausea or vomiting  PHYSICAL  EXAMINATION  GENERAL: no acute distress, normal body habitus SKIN:  Left knee surgical incision is covered with dry dressing, mild bruising, no redness EYES: conjunctivae normal, sclerae normal, normal eye lids NECK: supple, trachea midline, no neck masses, no thyroid tenderness, no thyromegaly LYMPHATICS: no LAN in the neck, no supraclavicular LAN RESPIRATORY: breathing is even & unlabored, BS CTAB CARDIAC: RRR, no murmur,no extra heart sounds, no edema GI: abdomen soft, normal BS, no masses, no tenderness, no hepatomegaly, no splenomegaly EXTREMITIES: Able to move 4 extremities PSYCHIATRIC: the patient is alert & oriented to person, affect & behavior appropriate  LABS/RADIOLOGY: Labs reviewed: Basic Metabolic Panel:  Recent Labs  12/30/14 1328 01/13/15 0530 01/14/15 0543  NA 140 138 136  K 3.7 3.6 4.0  CL 103 102 102  CO2 30 28 24   GLUCOSE 85 118* 116*  BUN 14 11 17   CREATININE 1.04 0.89 0.99  CALCIUM 10.2 9.0 9.6   Liver Function Tests:  Recent Labs  12/30/14 1328  AST 26  ALT 24  ALKPHOS 143*  BILITOT 0.8  PROT 7.0  ALBUMIN 3.9   CBC:  Recent Labs  12/30/14 1328 01/13/15 0530 01/14/15 0543  WBC 6.2 6.7 13.0*  NEUTROABS 2.9  --   --   HGB 13.0 10.1* 9.6*  HCT 39.3 30.5* 28.4*  MCV 90.1 90.0 90.2  PLT 278 240 217   CBG:  Recent Labs  01/12/15 1210 01/12/15 1451  GLUCAP 82 104*    Dg Knee Left Port  01/12/2015   CLINICAL DATA:  Status post  total knee replacement  EXAM: PORTABLE LEFT KNEE - 1-2 VIEW  COMPARISON:  None.  FINDINGS: Frontal and lateral views were obtained. Patient is status post total knee replacement with femoral and tibial prosthetic components appearing well-seated. No fracture dislocation. There is a drain lateral to the distal tibial metaphysis. Soft tissue air is an expected postoperative finding.  IMPRESSION: Prosthetic components appear well seated. No acute fracture or dislocation.   Electronically Signed   By: Lowella Grip III M.D.   On: 01/12/2015 16:49    ASSESSMENT/PLAN:  Osteoarthritis S/P left total knee arthroplasty - for home health PT and OT; continue Robaxin 500 mg by mouth every 6 hours when necessary for muscle spasm; Percocet 5/325 mg 1-2 tabs by mouth every 4 hours when necessary for pain; and Eliquis 2.5 mg 1 by mouth twice a day 5 more doses; continue plaquenil 200 mg 1 tab by mouth daily Anemia, acute blood loss - hemoglobin 9.6; stable Constipation - continue Colace 100 mg by mouth twice a day and MiraLAX 17 g by mouth twice a day when necessary Hypothyroidism - continue Synthroid 150 g by mouth daily Hyperlipidemia - continue atorvastatin 40 mg 1 by mouth daily Hypertension - well controlled; continue lisinopril HCTZ 10-12.5 mg by mouth daily Leukocytosis - no signs of infection; likely a stress response   I have filled out patient's discharge paperwork and written prescriptions.  Patient will receive home health PT and OT.  Total discharge time: Less than 30 minutes  Discharge time involved coordination of the discharge process with Education officer, museum, nursing staff and therapy department. Medical justification for home health services verified.     [facility], NP [employer] [phone]

## 2015-01-22 DIAGNOSIS — M1990 Unspecified osteoarthritis, unspecified site: Secondary | ICD-10-CM | POA: Diagnosis not present

## 2015-01-22 DIAGNOSIS — Z471 Aftercare following joint replacement surgery: Secondary | ICD-10-CM | POA: Diagnosis not present

## 2015-01-22 DIAGNOSIS — E119 Type 2 diabetes mellitus without complications: Secondary | ICD-10-CM | POA: Diagnosis not present

## 2015-01-22 DIAGNOSIS — M069 Rheumatoid arthritis, unspecified: Secondary | ICD-10-CM | POA: Diagnosis not present

## 2015-01-22 DIAGNOSIS — Z96652 Presence of left artificial knee joint: Secondary | ICD-10-CM | POA: Diagnosis not present

## 2015-01-22 DIAGNOSIS — I10 Essential (primary) hypertension: Secondary | ICD-10-CM | POA: Diagnosis not present

## 2015-01-24 DIAGNOSIS — M069 Rheumatoid arthritis, unspecified: Secondary | ICD-10-CM | POA: Diagnosis not present

## 2015-01-24 DIAGNOSIS — I10 Essential (primary) hypertension: Secondary | ICD-10-CM | POA: Diagnosis not present

## 2015-01-24 DIAGNOSIS — Z96652 Presence of left artificial knee joint: Secondary | ICD-10-CM | POA: Diagnosis not present

## 2015-01-24 DIAGNOSIS — Z471 Aftercare following joint replacement surgery: Secondary | ICD-10-CM | POA: Diagnosis not present

## 2015-01-24 DIAGNOSIS — E119 Type 2 diabetes mellitus without complications: Secondary | ICD-10-CM | POA: Diagnosis not present

## 2015-01-24 DIAGNOSIS — M1990 Unspecified osteoarthritis, unspecified site: Secondary | ICD-10-CM | POA: Diagnosis not present

## 2015-01-25 DIAGNOSIS — Z96652 Presence of left artificial knee joint: Secondary | ICD-10-CM | POA: Diagnosis not present

## 2015-01-26 DIAGNOSIS — I10 Essential (primary) hypertension: Secondary | ICD-10-CM | POA: Diagnosis not present

## 2015-01-26 DIAGNOSIS — Z471 Aftercare following joint replacement surgery: Secondary | ICD-10-CM | POA: Diagnosis not present

## 2015-01-26 DIAGNOSIS — M1990 Unspecified osteoarthritis, unspecified site: Secondary | ICD-10-CM | POA: Diagnosis not present

## 2015-01-26 DIAGNOSIS — M069 Rheumatoid arthritis, unspecified: Secondary | ICD-10-CM | POA: Diagnosis not present

## 2015-01-26 DIAGNOSIS — Z96652 Presence of left artificial knee joint: Secondary | ICD-10-CM | POA: Diagnosis not present

## 2015-01-26 DIAGNOSIS — E119 Type 2 diabetes mellitus without complications: Secondary | ICD-10-CM | POA: Diagnosis not present

## 2015-01-28 DIAGNOSIS — M1990 Unspecified osteoarthritis, unspecified site: Secondary | ICD-10-CM | POA: Diagnosis not present

## 2015-01-28 DIAGNOSIS — Z471 Aftercare following joint replacement surgery: Secondary | ICD-10-CM | POA: Diagnosis not present

## 2015-01-28 DIAGNOSIS — Z96652 Presence of left artificial knee joint: Secondary | ICD-10-CM | POA: Diagnosis not present

## 2015-01-28 DIAGNOSIS — M069 Rheumatoid arthritis, unspecified: Secondary | ICD-10-CM | POA: Diagnosis not present

## 2015-01-28 DIAGNOSIS — E119 Type 2 diabetes mellitus without complications: Secondary | ICD-10-CM | POA: Diagnosis not present

## 2015-01-28 DIAGNOSIS — I10 Essential (primary) hypertension: Secondary | ICD-10-CM | POA: Diagnosis not present

## 2015-01-31 DIAGNOSIS — I10 Essential (primary) hypertension: Secondary | ICD-10-CM | POA: Diagnosis not present

## 2015-01-31 DIAGNOSIS — M069 Rheumatoid arthritis, unspecified: Secondary | ICD-10-CM | POA: Diagnosis not present

## 2015-01-31 DIAGNOSIS — Z96652 Presence of left artificial knee joint: Secondary | ICD-10-CM | POA: Diagnosis not present

## 2015-01-31 DIAGNOSIS — Z471 Aftercare following joint replacement surgery: Secondary | ICD-10-CM | POA: Diagnosis not present

## 2015-01-31 DIAGNOSIS — E119 Type 2 diabetes mellitus without complications: Secondary | ICD-10-CM | POA: Diagnosis not present

## 2015-01-31 DIAGNOSIS — M1990 Unspecified osteoarthritis, unspecified site: Secondary | ICD-10-CM | POA: Diagnosis not present

## 2015-02-02 DIAGNOSIS — M1990 Unspecified osteoarthritis, unspecified site: Secondary | ICD-10-CM | POA: Diagnosis not present

## 2015-02-02 DIAGNOSIS — E119 Type 2 diabetes mellitus without complications: Secondary | ICD-10-CM | POA: Diagnosis not present

## 2015-02-02 DIAGNOSIS — Z96652 Presence of left artificial knee joint: Secondary | ICD-10-CM | POA: Diagnosis not present

## 2015-02-02 DIAGNOSIS — I10 Essential (primary) hypertension: Secondary | ICD-10-CM | POA: Diagnosis not present

## 2015-02-02 DIAGNOSIS — Z471 Aftercare following joint replacement surgery: Secondary | ICD-10-CM | POA: Diagnosis not present

## 2015-02-02 DIAGNOSIS — M069 Rheumatoid arthritis, unspecified: Secondary | ICD-10-CM | POA: Diagnosis not present

## 2015-02-04 DIAGNOSIS — I10 Essential (primary) hypertension: Secondary | ICD-10-CM | POA: Diagnosis not present

## 2015-02-04 DIAGNOSIS — E119 Type 2 diabetes mellitus without complications: Secondary | ICD-10-CM | POA: Diagnosis not present

## 2015-02-04 DIAGNOSIS — Z96652 Presence of left artificial knee joint: Secondary | ICD-10-CM | POA: Diagnosis not present

## 2015-02-04 DIAGNOSIS — M1990 Unspecified osteoarthritis, unspecified site: Secondary | ICD-10-CM | POA: Diagnosis not present

## 2015-02-04 DIAGNOSIS — M069 Rheumatoid arthritis, unspecified: Secondary | ICD-10-CM | POA: Diagnosis not present

## 2015-02-04 DIAGNOSIS — Z471 Aftercare following joint replacement surgery: Secondary | ICD-10-CM | POA: Diagnosis not present

## 2015-02-09 DIAGNOSIS — R262 Difficulty in walking, not elsewhere classified: Secondary | ICD-10-CM | POA: Diagnosis not present

## 2015-02-09 DIAGNOSIS — R531 Weakness: Secondary | ICD-10-CM | POA: Diagnosis not present

## 2015-02-09 DIAGNOSIS — M25662 Stiffness of left knee, not elsewhere classified: Secondary | ICD-10-CM | POA: Diagnosis not present

## 2015-02-09 DIAGNOSIS — M25562 Pain in left knee: Secondary | ICD-10-CM | POA: Diagnosis not present

## 2015-02-14 DIAGNOSIS — M25562 Pain in left knee: Secondary | ICD-10-CM | POA: Diagnosis not present

## 2015-02-14 DIAGNOSIS — R262 Difficulty in walking, not elsewhere classified: Secondary | ICD-10-CM | POA: Diagnosis not present

## 2015-02-14 DIAGNOSIS — M25662 Stiffness of left knee, not elsewhere classified: Secondary | ICD-10-CM | POA: Diagnosis not present

## 2015-02-14 DIAGNOSIS — R531 Weakness: Secondary | ICD-10-CM | POA: Diagnosis not present

## 2015-02-16 DIAGNOSIS — M25662 Stiffness of left knee, not elsewhere classified: Secondary | ICD-10-CM | POA: Diagnosis not present

## 2015-02-16 DIAGNOSIS — R531 Weakness: Secondary | ICD-10-CM | POA: Diagnosis not present

## 2015-02-16 DIAGNOSIS — R262 Difficulty in walking, not elsewhere classified: Secondary | ICD-10-CM | POA: Diagnosis not present

## 2015-02-16 DIAGNOSIS — M25562 Pain in left knee: Secondary | ICD-10-CM | POA: Diagnosis not present

## 2015-02-21 DIAGNOSIS — R531 Weakness: Secondary | ICD-10-CM | POA: Diagnosis not present

## 2015-02-21 DIAGNOSIS — R262 Difficulty in walking, not elsewhere classified: Secondary | ICD-10-CM | POA: Diagnosis not present

## 2015-02-21 DIAGNOSIS — M25662 Stiffness of left knee, not elsewhere classified: Secondary | ICD-10-CM | POA: Diagnosis not present

## 2015-02-21 DIAGNOSIS — M25562 Pain in left knee: Secondary | ICD-10-CM | POA: Diagnosis not present

## 2015-02-22 DIAGNOSIS — Z96652 Presence of left artificial knee joint: Secondary | ICD-10-CM | POA: Diagnosis not present

## 2015-02-23 DIAGNOSIS — M25562 Pain in left knee: Secondary | ICD-10-CM | POA: Diagnosis not present

## 2015-02-23 DIAGNOSIS — R262 Difficulty in walking, not elsewhere classified: Secondary | ICD-10-CM | POA: Diagnosis not present

## 2015-02-23 DIAGNOSIS — R531 Weakness: Secondary | ICD-10-CM | POA: Diagnosis not present

## 2015-02-23 DIAGNOSIS — M25662 Stiffness of left knee, not elsewhere classified: Secondary | ICD-10-CM | POA: Diagnosis not present

## 2015-03-01 DIAGNOSIS — M25662 Stiffness of left knee, not elsewhere classified: Secondary | ICD-10-CM | POA: Diagnosis not present

## 2015-03-01 DIAGNOSIS — R262 Difficulty in walking, not elsewhere classified: Secondary | ICD-10-CM | POA: Diagnosis not present

## 2015-03-01 DIAGNOSIS — R531 Weakness: Secondary | ICD-10-CM | POA: Diagnosis not present

## 2015-03-01 DIAGNOSIS — M25562 Pain in left knee: Secondary | ICD-10-CM | POA: Diagnosis not present

## 2015-03-02 DIAGNOSIS — E039 Hypothyroidism, unspecified: Secondary | ICD-10-CM | POA: Diagnosis not present

## 2015-03-03 DIAGNOSIS — M25562 Pain in left knee: Secondary | ICD-10-CM | POA: Diagnosis not present

## 2015-03-03 DIAGNOSIS — R531 Weakness: Secondary | ICD-10-CM | POA: Diagnosis not present

## 2015-03-03 DIAGNOSIS — R262 Difficulty in walking, not elsewhere classified: Secondary | ICD-10-CM | POA: Diagnosis not present

## 2015-03-03 DIAGNOSIS — M25662 Stiffness of left knee, not elsewhere classified: Secondary | ICD-10-CM | POA: Diagnosis not present

## 2015-03-09 DIAGNOSIS — M25562 Pain in left knee: Secondary | ICD-10-CM | POA: Diagnosis not present

## 2015-03-09 DIAGNOSIS — M25662 Stiffness of left knee, not elsewhere classified: Secondary | ICD-10-CM | POA: Diagnosis not present

## 2015-03-09 DIAGNOSIS — R262 Difficulty in walking, not elsewhere classified: Secondary | ICD-10-CM | POA: Diagnosis not present

## 2015-03-09 DIAGNOSIS — R531 Weakness: Secondary | ICD-10-CM | POA: Diagnosis not present

## 2015-03-30 ENCOUNTER — Other Ambulatory Visit: Payer: Self-pay

## 2015-03-30 DIAGNOSIS — Z1231 Encounter for screening mammogram for malignant neoplasm of breast: Secondary | ICD-10-CM

## 2015-04-08 DIAGNOSIS — M1712 Unilateral primary osteoarthritis, left knee: Secondary | ICD-10-CM | POA: Diagnosis not present

## 2015-04-12 DIAGNOSIS — Z09 Encounter for follow-up examination after completed treatment for conditions other than malignant neoplasm: Secondary | ICD-10-CM | POA: Diagnosis not present

## 2015-04-12 DIAGNOSIS — M0579 Rheumatoid arthritis with rheumatoid factor of multiple sites without organ or systems involvement: Secondary | ICD-10-CM | POA: Diagnosis not present

## 2015-04-12 DIAGNOSIS — M79641 Pain in right hand: Secondary | ICD-10-CM | POA: Diagnosis not present

## 2015-04-12 DIAGNOSIS — M25561 Pain in right knee: Secondary | ICD-10-CM | POA: Diagnosis not present

## 2015-04-15 ENCOUNTER — Ambulatory Visit
Admission: RE | Admit: 2015-04-15 | Discharge: 2015-04-15 | Disposition: A | Discharge status: Home / Self Care | Payer: Medicare Other | Source: Ambulatory Visit

## 2015-04-15 DIAGNOSIS — Z1231 Encounter for screening mammogram for malignant neoplasm of breast: Secondary | ICD-10-CM | POA: Diagnosis not present

## 2015-04-19 ENCOUNTER — Other Ambulatory Visit: Payer: Self-pay | Admitting: Internal Medicine

## 2015-04-19 DIAGNOSIS — R928 Other abnormal and inconclusive findings on diagnostic imaging of breast: Secondary | ICD-10-CM

## 2015-04-25 ENCOUNTER — Ambulatory Visit
Admission: RE | Admit: 2015-04-25 | Discharge: 2015-04-25 | Disposition: A | Discharge status: Home / Self Care | Payer: Medicare Other | Source: Ambulatory Visit | Attending: Internal Medicine | Admitting: Internal Medicine

## 2015-04-25 DIAGNOSIS — N63 Unspecified lump in breast: Secondary | ICD-10-CM | POA: Diagnosis not present

## 2015-04-25 DIAGNOSIS — R928 Other abnormal and inconclusive findings on diagnostic imaging of breast: Secondary | ICD-10-CM

## 2015-04-27 DIAGNOSIS — N182 Chronic kidney disease, stage 2 (mild): Secondary | ICD-10-CM | POA: Diagnosis not present

## 2015-04-27 DIAGNOSIS — Z6834 Body mass index (BMI) 34.0-34.9, adult: Secondary | ICD-10-CM | POA: Diagnosis not present

## 2015-04-27 DIAGNOSIS — E039 Hypothyroidism, unspecified: Secondary | ICD-10-CM | POA: Diagnosis not present

## 2015-04-27 DIAGNOSIS — I129 Hypertensive chronic kidney disease with stage 1 through stage 4 chronic kidney disease, or unspecified chronic kidney disease: Secondary | ICD-10-CM | POA: Diagnosis not present

## 2015-06-21 DIAGNOSIS — H40013 Open angle with borderline findings, low risk, bilateral: Secondary | ICD-10-CM | POA: Diagnosis not present

## 2015-07-21 DIAGNOSIS — N182 Chronic kidney disease, stage 2 (mild): Secondary | ICD-10-CM | POA: Diagnosis not present

## 2015-07-21 DIAGNOSIS — R7309 Other abnormal glucose: Secondary | ICD-10-CM | POA: Diagnosis not present

## 2015-07-21 DIAGNOSIS — Z79899 Other long term (current) drug therapy: Secondary | ICD-10-CM | POA: Diagnosis not present

## 2015-07-21 DIAGNOSIS — I129 Hypertensive chronic kidney disease with stage 1 through stage 4 chronic kidney disease, or unspecified chronic kidney disease: Secondary | ICD-10-CM | POA: Diagnosis not present

## 2015-07-21 DIAGNOSIS — E039 Hypothyroidism, unspecified: Secondary | ICD-10-CM | POA: Diagnosis not present

## 2015-08-15 DIAGNOSIS — Z79899 Other long term (current) drug therapy: Secondary | ICD-10-CM | POA: Diagnosis not present

## 2015-08-23 DIAGNOSIS — Z1211 Encounter for screening for malignant neoplasm of colon: Secondary | ICD-10-CM | POA: Diagnosis not present

## 2015-08-23 DIAGNOSIS — K5900 Constipation, unspecified: Secondary | ICD-10-CM | POA: Diagnosis not present

## 2015-09-13 DIAGNOSIS — R899 Unspecified abnormal finding in specimens from other organs, systems and tissues: Secondary | ICD-10-CM | POA: Diagnosis not present

## 2015-09-13 DIAGNOSIS — M0579 Rheumatoid arthritis with rheumatoid factor of multiple sites without organ or systems involvement: Secondary | ICD-10-CM | POA: Diagnosis not present

## 2015-09-13 DIAGNOSIS — M170 Bilateral primary osteoarthritis of knee: Secondary | ICD-10-CM | POA: Diagnosis not present

## 2015-09-13 DIAGNOSIS — Z79899 Other long term (current) drug therapy: Secondary | ICD-10-CM | POA: Diagnosis not present

## 2015-10-19 ENCOUNTER — Other Ambulatory Visit: Payer: Self-pay | Admitting: Internal Medicine

## 2015-10-19 DIAGNOSIS — N6489 Other specified disorders of breast: Secondary | ICD-10-CM

## 2015-10-19 DIAGNOSIS — N63 Unspecified lump in unspecified breast: Secondary | ICD-10-CM

## 2015-10-31 ENCOUNTER — Ambulatory Visit
Admission: RE | Admit: 2015-10-31 | Discharge: 2015-10-31 | Disposition: A | Discharge status: Home / Self Care | Payer: Medicare Other | Source: Ambulatory Visit | Attending: Internal Medicine | Admitting: Internal Medicine

## 2015-10-31 DIAGNOSIS — N6489 Other specified disorders of breast: Secondary | ICD-10-CM

## 2015-10-31 DIAGNOSIS — N6001 Solitary cyst of right breast: Secondary | ICD-10-CM | POA: Diagnosis not present

## 2015-10-31 DIAGNOSIS — N63 Unspecified lump in unspecified breast: Secondary | ICD-10-CM

## 2015-11-28 DIAGNOSIS — H40013 Open angle with borderline findings, low risk, bilateral: Secondary | ICD-10-CM | POA: Diagnosis not present

## 2015-11-28 DIAGNOSIS — H40053 Ocular hypertension, bilateral: Secondary | ICD-10-CM | POA: Diagnosis not present

## 2016-01-03 DIAGNOSIS — M810 Age-related osteoporosis without current pathological fracture: Secondary | ICD-10-CM | POA: Diagnosis not present

## 2016-01-03 DIAGNOSIS — E039 Hypothyroidism, unspecified: Secondary | ICD-10-CM | POA: Diagnosis not present

## 2016-01-03 DIAGNOSIS — N182 Chronic kidney disease, stage 2 (mild): Secondary | ICD-10-CM | POA: Diagnosis not present

## 2016-01-03 DIAGNOSIS — I129 Hypertensive chronic kidney disease with stage 1 through stage 4 chronic kidney disease, or unspecified chronic kidney disease: Secondary | ICD-10-CM | POA: Diagnosis not present

## 2016-01-03 DIAGNOSIS — Z13820 Encounter for screening for osteoporosis: Secondary | ICD-10-CM | POA: Diagnosis not present

## 2016-01-03 DIAGNOSIS — Z0000 Encounter for general adult medical examination without abnormal findings: Secondary | ICD-10-CM | POA: Diagnosis not present

## 2016-01-03 DIAGNOSIS — R7309 Other abnormal glucose: Secondary | ICD-10-CM | POA: Diagnosis not present

## 2016-01-04 DIAGNOSIS — M62838 Other muscle spasm: Secondary | ICD-10-CM | POA: Diagnosis not present

## 2016-01-12 DIAGNOSIS — M0579 Rheumatoid arthritis with rheumatoid factor of multiple sites without organ or systems involvement: Secondary | ICD-10-CM | POA: Diagnosis not present

## 2016-01-12 DIAGNOSIS — Z79899 Other long term (current) drug therapy: Secondary | ICD-10-CM | POA: Diagnosis not present

## 2016-01-12 DIAGNOSIS — M79641 Pain in right hand: Secondary | ICD-10-CM | POA: Diagnosis not present

## 2016-01-12 DIAGNOSIS — Z09 Encounter for follow-up examination after completed treatment for conditions other than malignant neoplasm: Secondary | ICD-10-CM | POA: Diagnosis not present

## 2016-05-02 ENCOUNTER — Other Ambulatory Visit: Payer: Self-pay | Admitting: Internal Medicine

## 2016-05-02 DIAGNOSIS — N63 Unspecified lump in unspecified breast: Secondary | ICD-10-CM

## 2016-05-10 ENCOUNTER — Ambulatory Visit
Admission: RE | Admit: 2016-05-10 | Discharge: 2016-05-10 | Disposition: A | Discharge status: Home / Self Care | Payer: Medicare Other | Source: Ambulatory Visit | Attending: Internal Medicine | Admitting: Internal Medicine

## 2016-05-10 DIAGNOSIS — N63 Unspecified lump in unspecified breast: Secondary | ICD-10-CM

## 2016-05-10 DIAGNOSIS — R928 Other abnormal and inconclusive findings on diagnostic imaging of breast: Secondary | ICD-10-CM | POA: Diagnosis not present

## 2016-06-19 DIAGNOSIS — M5030 Other cervical disc degeneration, unspecified cervical region: Secondary | ICD-10-CM | POA: Diagnosis not present

## 2016-06-19 DIAGNOSIS — Z79899 Other long term (current) drug therapy: Secondary | ICD-10-CM | POA: Diagnosis not present

## 2016-06-19 DIAGNOSIS — M0579 Rheumatoid arthritis with rheumatoid factor of multiple sites without organ or systems involvement: Secondary | ICD-10-CM | POA: Diagnosis not present

## 2016-06-19 DIAGNOSIS — Z09 Encounter for follow-up examination after completed treatment for conditions other than malignant neoplasm: Secondary | ICD-10-CM | POA: Diagnosis not present

## 2016-06-19 DIAGNOSIS — M170 Bilateral primary osteoarthritis of knee: Secondary | ICD-10-CM | POA: Diagnosis not present

## 2016-07-02 DIAGNOSIS — E785 Hyperlipidemia, unspecified: Secondary | ICD-10-CM | POA: Diagnosis not present

## 2016-07-02 DIAGNOSIS — N182 Chronic kidney disease, stage 2 (mild): Secondary | ICD-10-CM | POA: Diagnosis not present

## 2016-07-02 DIAGNOSIS — I129 Hypertensive chronic kidney disease with stage 1 through stage 4 chronic kidney disease, or unspecified chronic kidney disease: Secondary | ICD-10-CM | POA: Diagnosis not present

## 2016-07-02 DIAGNOSIS — R7309 Other abnormal glucose: Secondary | ICD-10-CM | POA: Diagnosis not present

## 2016-07-02 DIAGNOSIS — E039 Hypothyroidism, unspecified: Secondary | ICD-10-CM | POA: Diagnosis not present

## 2016-07-07 ENCOUNTER — Other Ambulatory Visit: Payer: Self-pay | Admitting: Radiology

## 2016-07-07 DIAGNOSIS — Z79899 Other long term (current) drug therapy: Secondary | ICD-10-CM

## 2016-12-04 DIAGNOSIS — H40013 Open angle with borderline findings, low risk, bilateral: Secondary | ICD-10-CM | POA: Diagnosis not present

## 2016-12-04 DIAGNOSIS — Z83511 Family history of glaucoma: Secondary | ICD-10-CM | POA: Diagnosis not present

## 2016-12-18 DIAGNOSIS — H40013 Open angle with borderline findings, low risk, bilateral: Secondary | ICD-10-CM | POA: Diagnosis not present

## 2017-02-05 DIAGNOSIS — E039 Hypothyroidism, unspecified: Secondary | ICD-10-CM | POA: Diagnosis not present

## 2017-02-05 DIAGNOSIS — R7309 Other abnormal glucose: Secondary | ICD-10-CM | POA: Diagnosis not present

## 2017-02-05 DIAGNOSIS — I129 Hypertensive chronic kidney disease with stage 1 through stage 4 chronic kidney disease, or unspecified chronic kidney disease: Secondary | ICD-10-CM | POA: Diagnosis not present

## 2017-02-05 DIAGNOSIS — E559 Vitamin D deficiency, unspecified: Secondary | ICD-10-CM | POA: Diagnosis not present

## 2017-02-05 DIAGNOSIS — Z0000 Encounter for general adult medical examination without abnormal findings: Secondary | ICD-10-CM | POA: Diagnosis not present

## 2017-02-05 DIAGNOSIS — N182 Chronic kidney disease, stage 2 (mild): Secondary | ICD-10-CM | POA: Diagnosis not present

## 2017-04-23 ENCOUNTER — Other Ambulatory Visit: Payer: Self-pay

## 2017-05-22 ENCOUNTER — Other Ambulatory Visit: Payer: Self-pay | Admitting: Internal Medicine

## 2017-05-22 DIAGNOSIS — N63 Unspecified lump in unspecified breast: Secondary | ICD-10-CM

## 2017-06-06 ENCOUNTER — Ambulatory Visit
Admission: RE | Admit: 2017-06-06 | Discharge: 2017-06-06 | Disposition: A | Discharge status: Home / Self Care | Payer: Medicare Other | Source: Ambulatory Visit | Attending: Internal Medicine | Admitting: Internal Medicine

## 2017-06-06 DIAGNOSIS — N63 Unspecified lump in unspecified breast: Secondary | ICD-10-CM

## 2017-06-06 DIAGNOSIS — R928 Other abnormal and inconclusive findings on diagnostic imaging of breast: Secondary | ICD-10-CM | POA: Diagnosis not present

## 2017-06-20 DIAGNOSIS — H18893 Other specified disorders of cornea, bilateral: Secondary | ICD-10-CM | POA: Diagnosis not present

## 2017-06-20 DIAGNOSIS — Z83511 Family history of glaucoma: Secondary | ICD-10-CM | POA: Diagnosis not present

## 2017-06-20 DIAGNOSIS — H40013 Open angle with borderline findings, low risk, bilateral: Secondary | ICD-10-CM | POA: Diagnosis not present

## 2017-08-07 DIAGNOSIS — Z23 Encounter for immunization: Secondary | ICD-10-CM | POA: Diagnosis not present

## 2017-08-07 DIAGNOSIS — N182 Chronic kidney disease, stage 2 (mild): Secondary | ICD-10-CM | POA: Diagnosis not present

## 2017-08-07 DIAGNOSIS — E785 Hyperlipidemia, unspecified: Secondary | ICD-10-CM | POA: Diagnosis not present

## 2017-08-07 DIAGNOSIS — R7309 Other abnormal glucose: Secondary | ICD-10-CM | POA: Diagnosis not present

## 2017-08-07 DIAGNOSIS — I129 Hypertensive chronic kidney disease with stage 1 through stage 4 chronic kidney disease, or unspecified chronic kidney disease: Secondary | ICD-10-CM | POA: Diagnosis not present

## 2017-08-07 DIAGNOSIS — E039 Hypothyroidism, unspecified: Secondary | ICD-10-CM | POA: Diagnosis not present

## 2017-10-15 DIAGNOSIS — E039 Hypothyroidism, unspecified: Secondary | ICD-10-CM | POA: Diagnosis not present

## 2017-12-20 DIAGNOSIS — H40053 Ocular hypertension, bilateral: Secondary | ICD-10-CM | POA: Diagnosis not present

## 2017-12-20 DIAGNOSIS — H40013 Open angle with borderline findings, low risk, bilateral: Secondary | ICD-10-CM | POA: Diagnosis not present

## 2017-12-20 DIAGNOSIS — H16143 Punctate keratitis, bilateral: Secondary | ICD-10-CM | POA: Diagnosis not present

## 2017-12-20 DIAGNOSIS — Z9849 Cataract extraction status, unspecified eye: Secondary | ICD-10-CM | POA: Diagnosis not present

## 2017-12-20 DIAGNOSIS — Z83511 Family history of glaucoma: Secondary | ICD-10-CM | POA: Diagnosis not present

## 2017-12-20 DIAGNOSIS — H18893 Other specified disorders of cornea, bilateral: Secondary | ICD-10-CM | POA: Diagnosis not present

## 2017-12-20 DIAGNOSIS — Z961 Presence of intraocular lens: Secondary | ICD-10-CM | POA: Diagnosis not present

## 2018-02-06 DIAGNOSIS — I129 Hypertensive chronic kidney disease with stage 1 through stage 4 chronic kidney disease, or unspecified chronic kidney disease: Secondary | ICD-10-CM | POA: Diagnosis not present

## 2018-02-06 DIAGNOSIS — N182 Chronic kidney disease, stage 2 (mild): Secondary | ICD-10-CM | POA: Diagnosis not present

## 2018-03-18 DIAGNOSIS — I129 Hypertensive chronic kidney disease with stage 1 through stage 4 chronic kidney disease, or unspecified chronic kidney disease: Secondary | ICD-10-CM | POA: Diagnosis not present

## 2018-03-18 DIAGNOSIS — N182 Chronic kidney disease, stage 2 (mild): Secondary | ICD-10-CM | POA: Diagnosis not present

## 2018-03-18 DIAGNOSIS — R7309 Other abnormal glucose: Secondary | ICD-10-CM | POA: Diagnosis not present

## 2018-03-18 DIAGNOSIS — E559 Vitamin D deficiency, unspecified: Secondary | ICD-10-CM | POA: Diagnosis not present

## 2018-03-18 DIAGNOSIS — E039 Hypothyroidism, unspecified: Secondary | ICD-10-CM | POA: Diagnosis not present

## 2018-03-27 ENCOUNTER — Other Ambulatory Visit: Payer: Self-pay | Admitting: Internal Medicine

## 2018-03-27 ENCOUNTER — Encounter: Payer: Self-pay | Admitting: Podiatry

## 2018-03-27 ENCOUNTER — Ambulatory Visit (INDEPENDENT_AMBULATORY_CARE_PROVIDER_SITE_OTHER): Payer: Medicare Other | Admitting: Podiatry

## 2018-03-27 ENCOUNTER — Ambulatory Visit (INDEPENDENT_AMBULATORY_CARE_PROVIDER_SITE_OTHER): Payer: Medicare Other

## 2018-03-27 VITALS — BP 135/82 | HR 89 | Resp 16

## 2018-03-27 DIAGNOSIS — M779 Enthesopathy, unspecified: Secondary | ICD-10-CM | POA: Diagnosis not present

## 2018-03-27 DIAGNOSIS — E2839 Other primary ovarian failure: Secondary | ICD-10-CM

## 2018-03-27 DIAGNOSIS — M7752 Other enthesopathy of left foot: Secondary | ICD-10-CM

## 2018-03-27 DIAGNOSIS — B351 Tinea unguium: Secondary | ICD-10-CM

## 2018-03-27 MED ORDER — TRIAMCINOLONE ACETONIDE 10 MG/ML IJ SUSP
10.0000 mg | Freq: Once | INTRAMUSCULAR | Status: AC
  Administered 2018-03-27: 10 mg

## 2018-03-27 NOTE — Progress Notes (Signed)
   Subjective:    Patient ID: Hannah Castillo, female    DOB: [birth year], 77 y.o.   MRN: [id number]  HPI    Review of Systems  All other systems reviewed and are negative.      Objective:   Physical Exam        Assessment & Plan:

## 2018-03-28 NOTE — Progress Notes (Signed)
Subjective:   Patient ID: Hannah Castillo, female   DOB: 77 y.o.   MRN: [id number]   HPI Patient presents stating she is had a lot of pain on top of the left foot that is been making it hard to wear shoe gear comfortably and also is found to have thickness of the nailbeds bilateral that at times are bothersome.  Patient does not smoke likes to be active   Review of Systems  All other systems reviewed and are negative.       Objective:  Physical Exam  Constitutional: She appears well-developed and well-nourished.  Cardiovascular: Intact distal pulses.  Pulmonary/Chest: Effort normal.  Musculoskeletal: Normal range of motion.  Neurological: She is alert.  Skin: Skin is warm.  Nursing note and vitals reviewed.   Neurovascular status intact muscle strength is adequate range of motion within normal limits with patient noted to have structural inflammation of the left dorsum foot with fluid buildup and is noted to have nail disease with thickness of the underlying nailbeds     Assessment:  Patient has several different conditions with one being dorsal tendinitis left and the second being nail disease 1-5 both feet, mild to moderate nature     Plan:  H&P x-ray left reviewed and today I did a dorsal tendon sheath injection left 3 mg Kenalog 5 mg Xylocaine and I went ahead discussed the nails do not recommend treatment but at one point we may consider oral treatment or debridement.  Reappoint as symptoms indicate for either of these conditions  X-rays indicate that there is mild arthritis of the midtarsal joint left with no other significant pathology

## 2018-06-23 DIAGNOSIS — H40013 Open angle with borderline findings, low risk, bilateral: Secondary | ICD-10-CM | POA: Diagnosis not present

## 2018-07-03 ENCOUNTER — Other Ambulatory Visit: Payer: Self-pay | Admitting: Internal Medicine

## 2018-08-08 ENCOUNTER — Other Ambulatory Visit: Payer: Self-pay | Admitting: Internal Medicine

## 2018-08-08 DIAGNOSIS — Z1231 Encounter for screening mammogram for malignant neoplasm of breast: Secondary | ICD-10-CM

## 2018-08-20 ENCOUNTER — Other Ambulatory Visit: Payer: Self-pay

## 2018-09-10 ENCOUNTER — Ambulatory Visit: Payer: Medicare Other | Admitting: Internal Medicine

## 2018-09-10 ENCOUNTER — Encounter: Payer: Self-pay | Admitting: Internal Medicine

## 2018-09-10 ENCOUNTER — Ambulatory Visit (INDEPENDENT_AMBULATORY_CARE_PROVIDER_SITE_OTHER): Payer: Medicare Other | Admitting: Internal Medicine

## 2018-09-10 VITALS — BP 130/78 | HR 80 | Temp 98.0°F | Ht 65.0 in | Wt 201.4 lb

## 2018-09-10 DIAGNOSIS — Z6833 Body mass index (BMI) 33.0-33.9, adult: Secondary | ICD-10-CM | POA: Diagnosis not present

## 2018-09-10 DIAGNOSIS — N182 Chronic kidney disease, stage 2 (mild): Secondary | ICD-10-CM

## 2018-09-10 DIAGNOSIS — E6609 Other obesity due to excess calories: Secondary | ICD-10-CM | POA: Diagnosis not present

## 2018-09-10 DIAGNOSIS — E039 Hypothyroidism, unspecified: Secondary | ICD-10-CM | POA: Diagnosis not present

## 2018-09-10 DIAGNOSIS — R7309 Other abnormal glucose: Secondary | ICD-10-CM | POA: Diagnosis not present

## 2018-09-10 DIAGNOSIS — I129 Hypertensive chronic kidney disease with stage 1 through stage 4 chronic kidney disease, or unspecified chronic kidney disease: Secondary | ICD-10-CM | POA: Diagnosis not present

## 2018-09-11 LAB — CMP14+EGFR
A/G RATIO: 1.6 (ref 1.2–2.2)
ALBUMIN: 4.2 g/dL (ref 3.5–4.8)
ALT: 15 IU/L (ref 0–32)
AST: 19 IU/L (ref 0–40)
Alkaline Phosphatase: 147 IU/L — ABNORMAL HIGH (ref 39–117)
BILIRUBIN TOTAL: 0.6 mg/dL (ref 0.0–1.2)
BUN / CREAT RATIO: 15 (ref 12–28)
BUN: 15 mg/dL (ref 8–27)
CO2: 26 mmol/L (ref 20–29)
CREATININE: 1 mg/dL (ref 0.57–1.00)
Calcium: 10.3 mg/dL (ref 8.7–10.3)
Chloride: 99 mmol/L (ref 96–106)
GFR calc Af Amer: 62 mL/min/{1.73_m2} (ref >59)
GFR, EST NON AFRICAN AMERICAN: 54 mL/min/{1.73_m2} — AB (ref >59)
Globulin, Total: 2.6 g/dL (ref 1.5–4.5)
Glucose: 84 mg/dL (ref 65–99)
Potassium: 4.5 mmol/L (ref 3.5–5.2)
Sodium: 139 mmol/L (ref 134–144)
Total Protein: 6.8 g/dL (ref 6.0–8.5)

## 2018-09-11 LAB — T4, FREE: Free T4: 1.24 ng/dL (ref 0.82–1.77)

## 2018-09-11 LAB — LIPID PANEL
CHOL/HDL RATIO: 2.9 ratio (ref 0.0–4.4)
CHOLESTEROL TOTAL: 197 mg/dL (ref 100–199)
HDL: 69 mg/dL (ref >39)
LDL CALC: 119 mg/dL — AB (ref 0–99)
Triglycerides: 45 mg/dL (ref 0–149)
VLDL CHOLESTEROL CAL: 9 mg/dL (ref 5–40)

## 2018-09-11 LAB — HEMOGLOBIN A1C
ESTIMATED AVERAGE GLUCOSE: 120 mg/dL
Hgb A1c MFr Bld: 5.8 % — ABNORMAL HIGH (ref 4.8–5.6)

## 2018-09-11 LAB — TSH: TSH: 5.99 u[IU]/mL — AB (ref 0.450–4.500)

## 2018-09-11 NOTE — Progress Notes (Signed)
Your kidney fxn is nl. Your liver fxn is nl. Your hba1c is 5.8, this is in predm range. Your LDL, bad chol is 119. Ideally, this should be less than 100 - not too bad. Pls avoid fried foods and exercise 0 minutes five days weekly. YOUR TSH IS ELEVATED. ARE YOU TAKING YOUR SYNTHROID EACH AND EVERY DAY? DO YOU TAKE VITAMINS? IF YES, TAKE AT LUNCH OR DINNER - NOT BREAKFAST.  KW=-PLS SCHEDULE LAB VISIT IN 8 WEEKS. WITH LAB ONLY, CHECK TSH DX: E03.9.

## 2018-09-12 ENCOUNTER — Other Ambulatory Visit: Payer: Self-pay

## 2018-09-15 ENCOUNTER — Encounter: Payer: Self-pay | Admitting: Internal Medicine

## 2018-09-15 NOTE — Progress Notes (Signed)
Subjective:     Patient ID: Hannah Castillo , female    DOB: 09/03/1941 , 77 y.o.   MRN: [id number]   Chief Complaint  Patient presents with  . Hypertension  . Hypothyroidism    HPI  Hypertension  This is a chronic problem. The current episode started more than 1 year ago. The problem has been gradually improving since onset. The problem is controlled. Pertinent negatives include no blurred vision, chest pain, headaches, palpitations or shortness of breath. Risk factors for coronary artery disease include dyslipidemia, obesity and post-menopausal state. Compliance problems include psychosocial issues.  Hypertensive end-organ damage includes kidney disease. Identifiable causes of hypertension include a thyroid problem.  Thyroid Problem  Presents for follow-up visit. Patient reports no palpitations. The symptoms have been stable.     Past Medical History:  Diagnosis Date  . Degenerative joint disease   . Diabetes mellitus without complication (HCC)    "prediabetic"  . History of bronchitis    "a long time ago"  . Hyperlipidemia    takes Zetia and Atorvastatin daily  . Hypertension    takes Lisinopril-HCTZ daily  . Hypothyroidism    takes Synthroid daily  . Joint pain   . PONV (postoperative nausea and vomiting)   . Rheumatoid arthritis(714.0)    takes Plaquenil daily     Family History  Problem Relation Age of Onset  . Asthma Mother   . Heart attack Father   . Anesthesia problems Neg Hx      Current Outpatient Medications:  .  Ascorbic Acid (VITAMIN C PO), Take 1 tablet by mouth daily., Disp: , Rfl:  .  aspirin 81 MG tablet, Take 81 mg by mouth daily. Po qd (start 01-24-15), Disp: , Rfl:  .  atorvastatin (LIPITOR) 40 MG tablet, Take 40 mg by mouth daily.  , Disp: , Rfl:  .  Cholecalciferol (VITAMIN D3) 50000 UNITS CAPS, Take 50,000 Units by mouth once a week., Disp: , Rfl: 0 .  ezetimibe (ZETIA) 10 MG tablet, TAKE 1 TABLET EVERY DAY, Disp: 90 tablet, Rfl: 4 .   levothyroxine (SYNTHROID, LEVOTHROID) 150 MCG tablet, Take 150 mcg by mouth daily. Monday - Friday, Disp: , Rfl:  .  lisinopril-hydrochlorothiazide (PRINZIDE,ZESTORETIC) 10-12.5 MG per tablet, Take 1 tablet by mouth daily.  , Disp: , Rfl:  .  omega-3 acid ethyl esters (LOVAZA) 1 g capsule, Take by mouth 2 (two) times daily., Disp: , Rfl:    Allergies  Allergen Reactions  . Hydrocodone Nausea Only    MAKES PT FEEL VERY "SICK"     Review of Systems  Constitutional: Negative.   Eyes: Negative for blurred vision.  Respiratory: Negative.  Negative for shortness of breath.   Cardiovascular: Negative.  Negative for chest pain and palpitations.  Gastrointestinal: Negative.   Neurological: Negative.  Negative for headaches.  Psychiatric/Behavioral: Negative.      Today's Vitals   09/10/18 1157  BP: 130/78  Pulse: 80  Temp: 98 F (36.7 C)  TempSrc: Oral  Weight: 201 lb 6.4 oz (91.4 kg)  Height: 5' 5" (1.651 m)  PainSc: 0-No pain   Body mass index is 33.51 kg/m.   Objective:  Physical Exam Vitals signs and nursing note reviewed.  Constitutional:      Appearance: Normal appearance. She is obese.  HENT:     Head: Normocephalic and atraumatic.  Cardiovascular:     Rate and Rhythm: Normal rate and regular rhythm.     Heart sounds: Normal heart sounds.    Pulmonary:     Effort: Pulmonary effort is normal.     Breath sounds: Normal breath sounds.  Skin:    General: Skin is warm and dry.  Neurological:     General: No focal deficit present.     Mental Status: She is alert.  Psychiatric:        Mood and Affect: Mood normal.         Assessment And Plan:     1. Hypertensive nephropathy  Well controlled. She will continue with current meds. She is encouraged to avoid adding salt to her foods. She will rto in six months for re-evaluation.   - CMP14+EGFR - Lipid Profile  2. Chronic renal disease, stage II  Chronic. She is encouraged to stay well hydrated.   3. Primary  hypothyroidism  I will check thyroid panel and adjust meds as needed.  - TSH - T4, Free  4. Other abnormal glucose  HER A1C HAS BEEN ELEVATED IN THE PAST. I WILL CHECK AN A1C, BMET TODAY. SHE WAS ENCOURAGED TO AVOID SUGARY BEVERAGES AND PROCESSED FOODS INCLUDNG BREADS, RICE AND PASTA.  - Hemoglobin A1c  5. Class 1 obesity due to excess calories with serious comorbidity and body mass index (BMI) of 33.0 to 33.9 in adult  She is encouraged to strive for BMI less than 30 to decrease cardiac risk. She is encouraged to exercise 30 minutes five days weekly.   Robyn N Sanders, MD  

## 2018-09-17 ENCOUNTER — Other Ambulatory Visit: Payer: Self-pay | Admitting: Internal Medicine

## 2018-09-19 ENCOUNTER — Ambulatory Visit
Admission: RE | Admit: 2018-09-19 | Discharge: 2018-09-19 | Disposition: A | Discharge status: Home / Self Care | Payer: Medicare Other | Source: Ambulatory Visit | Attending: Internal Medicine | Admitting: Internal Medicine

## 2018-09-19 DIAGNOSIS — Z1231 Encounter for screening mammogram for malignant neoplasm of breast: Secondary | ICD-10-CM | POA: Diagnosis not present

## 2018-10-20 ENCOUNTER — Other Ambulatory Visit: Payer: Medicare Other

## 2018-10-20 DIAGNOSIS — E039 Hypothyroidism, unspecified: Secondary | ICD-10-CM

## 2018-10-21 LAB — TSH: TSH: 0.746 u[IU]/mL (ref 0.450–4.500)

## 2018-10-24 ENCOUNTER — Other Ambulatory Visit: Payer: Self-pay | Admitting: Internal Medicine

## 2018-10-24 MED ORDER — LEVOTHYROXINE SODIUM 150 MCG PO TABS: ORAL_TABLET | ORAL | 0 refills | Status: DC

## 2018-12-25 ENCOUNTER — Other Ambulatory Visit: Payer: Self-pay | Admitting: Internal Medicine

## 2019-03-04 ENCOUNTER — Ambulatory Visit (INDEPENDENT_AMBULATORY_CARE_PROVIDER_SITE_OTHER): Payer: Medicare Other | Admitting: Internal Medicine

## 2019-03-04 ENCOUNTER — Other Ambulatory Visit: Payer: Self-pay

## 2019-03-04 ENCOUNTER — Encounter: Payer: Self-pay | Admitting: Internal Medicine

## 2019-03-04 ENCOUNTER — Ambulatory Visit (INDEPENDENT_AMBULATORY_CARE_PROVIDER_SITE_OTHER): Payer: Medicare Other

## 2019-03-04 VITALS — BP 142/84 | HR 99 | Temp 98.8°F | Ht 62.4 in | Wt 200.0 lb

## 2019-03-04 VITALS — BP 142/84 | HR 86 | Temp 98.8°F | Ht 62.4 in | Wt 200.2 lb

## 2019-03-04 DIAGNOSIS — Z0000 Encounter for general adult medical examination without abnormal findings: Secondary | ICD-10-CM | POA: Diagnosis not present

## 2019-03-04 DIAGNOSIS — I129 Hypertensive chronic kidney disease with stage 1 through stage 4 chronic kidney disease, or unspecified chronic kidney disease: Secondary | ICD-10-CM

## 2019-03-04 DIAGNOSIS — E039 Hypothyroidism, unspecified: Secondary | ICD-10-CM

## 2019-03-04 DIAGNOSIS — R7309 Other abnormal glucose: Secondary | ICD-10-CM

## 2019-03-04 DIAGNOSIS — N182 Chronic kidney disease, stage 2 (mild): Secondary | ICD-10-CM

## 2019-03-04 DIAGNOSIS — E7800 Pure hypercholesterolemia, unspecified: Secondary | ICD-10-CM | POA: Diagnosis not present

## 2019-03-04 LAB — POCT URINALYSIS DIPSTICK
Bilirubin, UA: NEGATIVE
Blood, UA: NEGATIVE
Glucose, UA: NEGATIVE
Ketones, UA: NEGATIVE
Leukocytes, UA: NEGATIVE
Nitrite, UA: NEGATIVE
Protein, UA: NEGATIVE
Spec Grav, UA: 1.015 (ref 1.010–1.025)
Urobilinogen, UA: 0.2 E.U./dL
pH, UA: 6.5 (ref 5.0–8.0)

## 2019-03-04 LAB — POCT UA - MICROALBUMIN
Albumin/Creatinine Ratio, Urine, POC: <30
Creatinine, POC: 200 mg/dL
Microalbumin Ur, POC: 10 mg/L

## 2019-03-04 NOTE — Patient Instructions (Addendum)
Hannah Castillo , Thank you for taking time to come for your Medicare Wellness Visit. I appreciate your ongoing commitment to your health goals. Please review the following plan we discussed and let me know if I can assist you in the future.   Screening recommendations/referrals: Colonoscopy: not required Mammogram: 08/2018 Bone Density: declined Recommended yearly ophthalmology/optometry visit for glaucoma screening and checkup Recommended yearly dental visit for hygiene and checkup  Vaccinations: Influenza vaccine: 08/2018 Pneumococcal vaccine: 01/2009 Tdap vaccine: 06/2012 Shingles vaccine: discussed    Advanced directives: Please bring a copy of your POA (Power of [place]) and/or Living Will to your next appointment.    Conditions/risks identified: Obesity  Next appointment: 09/07/2019 at 10:30   Preventive Care 65 Years and Older, Female Preventive care refers to lifestyle choices and visits with your health care provider that can promote health and wellness. What does preventive care include?  A yearly physical exam. This is also called an annual well check.  Dental exams once or twice a year.  Routine eye exams. Ask your health care provider how often you should have your eyes checked.  Personal lifestyle choices, including:  Daily care of your teeth and gums.  Regular physical activity.  Eating a healthy diet.  Avoiding tobacco and drug use.  Limiting alcohol use.  Practicing safe sex.  Taking low-dose aspirin every day.  Taking vitamin and mineral supplements as recommended by your health care provider. What happens during an annual well check? The services and screenings done by your health care provider during your annual well check will depend on your age, overall health, lifestyle risk factors, and family history of disease. Counseling  Your health care provider may ask you questions about your:  Alcohol use.  Tobacco use.  Drug use.  Emotional  well-being.  Home and relationship well-being.  Sexual activity.  Eating habits.  History of falls.  Memory and ability to understand (cognition).  Work and work Statistician.  Reproductive health. Screening  You may have the following tests or measurements:  Height, weight, and BMI.  Blood pressure.  Lipid and cholesterol levels. These may be checked every 5 years, or more frequently if you are over 58 years old.  Skin check.  Lung cancer screening. You may have this screening every year starting at age 17 if you have a 30-pack-year history of smoking and currently smoke or have quit within the past 15 years.  Fecal occult blood test (FOBT) of the stool. You may have this test every year starting at age 46.  Flexible sigmoidoscopy or colonoscopy. You may have a sigmoidoscopy every 5 years or a colonoscopy every 10 years starting at age 5.  Hepatitis C blood test.  Hepatitis B blood test.  Sexually transmitted disease (STD) testing.  Diabetes screening. This is done by checking your blood sugar (glucose) after you have not eaten for a while (fasting). You may have this done every 1-3 years.  Bone density scan. This is done to screen for osteoporosis. You may have this done starting at age 35.  Mammogram. This may be done every 1-2 years. Talk to your health care provider about how often you should have regular mammograms. Talk with your health care provider about your test results, treatment options, and if necessary, the need for more tests. Vaccines  Your health care provider may recommend certain vaccines, such as:  Influenza vaccine. This is recommended every year.  Tetanus, diphtheria, and acellular pertussis (Tdap, Td) vaccine. You may need a Td  booster every 10 years.  Zoster vaccine. You may need this after age 53.  Pneumococcal 13-valent conjugate (PCV13) vaccine. One dose is recommended after age 27.  Pneumococcal polysaccharide (PPSV23) vaccine. One  dose is recommended after age 21. Talk to your health care provider about which screenings and vaccines you need and how often you need them. This information is not intended to replace advice given to you by your health care provider. Make sure you discuss any questions you have with your health care provider. Document Released: 10/14/2015 Document Revised: 06/06/2016 Document Reviewed: 07/19/2015 Elsevier Interactive Patient Education  2017 [place] Prevention in the Home Falls can cause injuries. They can happen to people of all ages. There are many things you can do to make your home safe and to help prevent falls. What can I do on the outside of my home?  Regularly fix the edges of walkways and driveways and fix any cracks.  Remove anything that might make you trip as you walk through a door, such as a raised step or threshold.  Trim any bushes or trees on the path to your home.  Use bright outdoor lighting.  Clear any walking paths of anything that might make someone trip, such as rocks or tools.  Regularly check to see if handrails are loose or broken. Make sure that both sides of any steps have handrails.  Any raised decks and porches should have guardrails on the edges.  Have any leaves, snow, or ice cleared regularly.  Use sand or salt on walking paths during winter.  Clean up any spills in your garage right away. This includes oil or grease spills. What can I do in the bathroom?  Use night lights.  Install grab bars by the toilet and in the tub and shower. Do not use towel bars as grab bars.  Use non-skid mats or decals in the tub or shower.  If you need to sit down in the shower, use a plastic, non-slip stool.  Keep the floor dry. Clean up any water that spills on the floor as soon as it happens.  Remove soap buildup in the tub or shower regularly.  Attach bath mats securely with double-sided non-slip rug tape.  Do not have throw rugs and other  things on the floor that can make you trip. What can I do in the bedroom?  Use night lights.  Make sure that you have a light by your bed that is easy to reach.  Do not use any sheets or blankets that are too big for your bed. They should not hang down onto the floor.  Have a firm chair that has side arms. You can use this for support while you get dressed.  Do not have throw rugs and other things on the floor that can make you trip. What can I do in the kitchen?  Clean up any spills right away.  Avoid walking on wet floors.  Keep items that you use a lot in easy-to-reach places.  If you need to reach something above you, use a strong step stool that has a grab bar.  Keep electrical cords out of the way.  Do not use floor polish or wax that makes floors slippery. If you must use wax, use non-skid floor wax.  Do not have throw rugs and other things on the floor that can make you trip. What can I do with my stairs?  Do not leave any items on the stairs.  Make sure that there are handrails on both sides of the stairs and use them. Fix handrails that are broken or loose. Make sure that handrails are as long as the stairways.  Check any carpeting to make sure that it is firmly attached to the stairs. Fix any carpet that is loose or worn.  Avoid having throw rugs at the top or bottom of the stairs. If you do have throw rugs, attach them to the floor with carpet tape.  Make sure that you have a light switch at the top of the stairs and the bottom of the stairs. If you do not have them, ask someone to add them for you. What else can I do to help prevent falls?  Wear shoes that:  Do not have high heels.  Have rubber bottoms.  Are comfortable and fit you well.  Are closed at the toe. Do not wear sandals.  If you use a stepladder:  Make sure that it is fully opened. Do not climb a closed stepladder.  Make sure that both sides of the stepladder are locked into place.  Ask  someone to hold it for you, if possible.  Clearly mark and make sure that you can see:  Any grab bars or handrails.  First and last steps.  Where the edge of each step is.  Use tools that help you move around (mobility aids) if they are needed. These include:  Canes.  Walkers.  Scooters.  Crutches.  Turn on the lights when you go into a dark area. Replace any light bulbs as soon as they burn out.  Set up your furniture so you have a clear path. Avoid moving your furniture around.  If any of your floors are uneven, fix them.  If there are any pets around you, be aware of where they are.  Review your medicines with your doctor. Some medicines can make you feel dizzy. This can increase your chance of falling. Ask your doctor what other things that you can do to help prevent falls. This information is not intended to replace advice given to you by your health care provider. Make sure you discuss any questions you have with your health care provider. Document Released: 07/14/2009 Document Revised: 02/23/2016 Document Reviewed: 10/22/2014 Elsevier Interactive Patient Education  2017 [employer].

## 2019-03-04 NOTE — Patient Instructions (Signed)
DASH Eating Plan  DASH stands for "Dietary Approaches to Stop Hypertension." The DASH eating plan is a healthy eating plan that has been shown to reduce high blood pressure (hypertension). It may also reduce your risk for type 2 diabetes, heart disease, and stroke. The DASH eating plan may also help with weight loss.  What are tips for following this plan?    General guidelines   Avoid eating more than 2,300 mg (milligrams) of salt (sodium) a day. If you have hypertension, you may need to reduce your sodium intake to 1,500 mg a day.   Limit alcohol intake to no more than 1 drink a day for nonpregnant women and 2 drinks a day for men. One drink equals 12 oz of beer, 5 oz of wine, or 1 oz of hard liquor.   Work with your health care provider to maintain a healthy body weight or to lose weight. Ask what an ideal weight is for you.   Get at least 30 minutes of exercise that causes your heart to beat faster (aerobic exercise) most days of the week. Activities may include walking, swimming, or biking.   Work with your health care provider or diet and nutrition specialist (dietitian) to adjust your eating plan to your individual calorie needs.  Reading food labels     Check food labels for the amount of sodium per serving. Choose foods with less than 5 percent of the Daily Value of sodium. Generally, foods with less than 300 mg of sodium per serving fit into this eating plan.   To find whole grains, look for the word "whole" as the first word in the ingredient list.  Shopping   Buy products labeled as "low-sodium" or "no salt added."   Buy fresh foods. Avoid canned foods and premade or frozen meals.  Cooking   Avoid adding salt when cooking. Use salt-free seasonings or herbs instead of table salt or sea salt. Check with your health care provider or pharmacist before using salt substitutes.   Do not fry foods. Cook foods using healthy methods such as baking, boiling, grilling, and broiling instead.   Cook with  heart-healthy oils, such as olive, canola, soybean, or sunflower oil.  Meal planning   Eat a balanced diet that includes:  ? 5 or more servings of fruits and vegetables each day. At each meal, try to fill half of your plate with fruits and vegetables.  ? Up to 6-8 servings of whole grains each day.  ? Less than 6 oz of lean meat, poultry, or fish each day. A 3-oz serving of meat is about the same size as a deck of cards. One egg equals 1 oz.  ? 2 servings of low-fat dairy each day.  ? A serving of nuts, seeds, or beans 5 times each week.  ? Heart-healthy fats. Healthy fats called Omega-3 fatty acids are found in foods such as flaxseeds and coldwater fish, like sardines, salmon, and mackerel.   Limit how much you eat of the following:  ? Canned or prepackaged foods.  ? Food that is high in trans fat, such as fried foods.  ? Food that is high in saturated fat, such as fatty meat.  ? Sweets, desserts, sugary drinks, and other foods with added sugar.  ? Full-fat dairy products.   Do not salt foods before eating.   Try to eat at least 2 vegetarian meals each week.   Eat more home-cooked food and less restaurant, buffet, and fast food.     When eating at a restaurant, ask that your food be prepared with less salt or no salt, if possible.  What foods are recommended?  The items listed may not be a complete list. Talk with your dietitian about what dietary choices are best for you.  Grains  Whole-grain or whole-wheat bread. Whole-grain or whole-wheat pasta. Brown rice. Oatmeal. Quinoa. Bulgur. Whole-grain and low-sodium cereals. Pita bread. Low-fat, low-sodium crackers. Whole-wheat flour tortillas.  Vegetables  Fresh or frozen vegetables (raw, steamed, roasted, or grilled). Low-sodium or reduced-sodium tomato and vegetable juice. Low-sodium or reduced-sodium tomato sauce and tomato paste. Low-sodium or reduced-sodium canned vegetables.  Fruits  All fresh, dried, or frozen fruit. Canned fruit in natural juice (without  added sugar).  Meat and other protein foods  Skinless chicken or turkey. Ground chicken or turkey. Pork with fat trimmed off. Fish and seafood. Egg whites. Dried beans, peas, or lentils. Unsalted nuts, nut butters, and seeds. Unsalted canned beans. Lean cuts of beef with fat trimmed off. Low-sodium, lean deli meat.  Dairy  Low-fat (1%) or fat-free (skim) milk. Fat-free, low-fat, or reduced-fat cheeses. Nonfat, low-sodium ricotta or cottage cheese. Low-fat or nonfat yogurt. Low-fat, low-sodium cheese.  Fats and oils  Soft margarine without trans fats. Vegetable oil. Low-fat, reduced-fat, or light mayonnaise and salad dressings (reduced-sodium). Canola, safflower, olive, soybean, and sunflower oils. Avocado.  Seasoning and other foods  Herbs. Spices. Seasoning mixes without salt. Unsalted popcorn and pretzels. Fat-free sweets.  What foods are not recommended?  The items listed may not be a complete list. Talk with your dietitian about what dietary choices are best for you.  Grains  Baked goods made with fat, such as croissants, muffins, or some breads. Dry pasta or rice meal packs.  Vegetables  Creamed or fried vegetables. Vegetables in a cheese sauce. Regular canned vegetables (not low-sodium or reduced-sodium). Regular canned tomato sauce and paste (not low-sodium or reduced-sodium). Regular tomato and vegetable juice (not low-sodium or reduced-sodium). Pickles. Olives.  Fruits  Canned fruit in a light or heavy syrup. Fried fruit. Fruit in cream or butter sauce.  Meat and other protein foods  Fatty cuts of meat. Ribs. Fried meat. Bacon. Sausage. Bologna and other processed lunch meats. Salami. Fatback. Hotdogs. Bratwurst. Salted nuts and seeds. Canned beans with added salt. Canned or smoked fish. Whole eggs or egg yolks. Chicken or turkey with skin.  Dairy  Whole or 2% milk, cream, and half-and-half. Whole or full-fat cream cheese. Whole-fat or sweetened yogurt. Full-fat cheese. Nondairy creamers. Whipped toppings.  Processed cheese and cheese spreads.  Fats and oils  Butter. Stick margarine. Lard. Shortening. Ghee. Bacon fat. Tropical oils, such as coconut, palm kernel, or palm oil.  Seasoning and other foods  Salted popcorn and pretzels. Onion salt, garlic salt, seasoned salt, table salt, and sea salt. Worcestershire sauce. Tartar sauce. Barbecue sauce. Teriyaki sauce. Soy sauce, including reduced-sodium. Steak sauce. Canned and packaged gravies. Fish sauce. Oyster sauce. Cocktail sauce. Horseradish that you find on the shelf. Ketchup. Mustard. Meat flavorings and tenderizers. Bouillon cubes. Hot sauce and Tabasco sauce. Premade or packaged marinades. Premade or packaged taco seasonings. Relishes. Regular salad dressings.  Where to find more information:   National Heart, Lung, and Blood Institute: www.nhlbi.nih.gov   American Heart Association: www.heart.org  Summary   The DASH eating plan is a healthy eating plan that has been shown to reduce high blood pressure (hypertension). It may also reduce your risk for type 2 diabetes, heart disease, and stroke.   With the   DASH eating plan, you should limit salt (sodium) intake to 2,300 mg a day. If you have hypertension, you may need to reduce your sodium intake to 1,500 mg a day.   When on the DASH eating plan, aim to eat more fresh fruits and vegetables, whole grains, lean proteins, low-fat dairy, and heart-healthy fats.   Work with your health care provider or diet and nutrition specialist (dietitian) to adjust your eating plan to your individual calorie needs.  This information is not intended to replace advice given to you by your health care provider. Make sure you discuss any questions you have with your health care provider.  Document Released: 09/06/2011 Document Revised: 09/10/2016 Document Reviewed: 09/10/2016  Elsevier Interactive Patient Education  2019 [employer]

## 2019-03-04 NOTE — Progress Notes (Signed)
Subjective:   Hannah Castillo is a 78 y.o. female who presents for Medicare Annual (Subsequent) preventive examination.  Review of Systems:  n/a Cardiac Risk Factors include: sedentary lifestyle;hypertension;advanced age (>62men, >61 women)     Objective:     Vitals: BP (!) 142/84 (BP Location: Left Arm, Patient Position: Sitting, Cuff Size: Normal)   Pulse 86   Temp 98.8 F (37.1 C) (Oral)   Ht 5' 2.4" (1.585 m)   Wt 200 lb 3.2 oz (90.8 kg)   SpO2 96%   BMI 36.15 kg/m   Body mass index is 36.15 kg/m.  Advanced Directives 03/04/2019 01/13/2015 12/30/2014 10/17/2011 10/12/2011 10/12/2011  Does Patient Have a Medical Advance Directive? Yes Yes Yes Patient would like information Patient would like information Patient does not have advance directive;Patient would not like information  Type of Scientist, forensic Power of [place];Living will Healthcare Power of [place] - - -  Does patient want to make changes to medical advance directive? - No - Patient declined - - - -  Copy of [place] in Chart? No - copy requested No - copy requested No - copy requested - - -  Would patient like information on creating a medical advance directive? - - - Advance directive packet given Advance directive packet given -  Pre-existing out of facility DNR order (yellow form or pink MOST form) - - - No - No    Tobacco Social History   Tobacco Use  Smoking Status Former Smoker  . Packs/day: 0.50  . Years: 40.00  . Pack years: 20.00  Smokeless Tobacco Never Used  Tobacco Comment   quit smoiking 36yrs ago     Counseling given: Not Answered Comment: quit smoiking 77yrs ago   Clinical Intake:  Pre-visit preparation completed: Yes  Pain : No/denies pain Pain Score: 0-No pain     Nutritional Status: BMI > 30  Obese Nutritional Risks: None Diabetes: No  How often do you need to have someone help you when you read instructions,  pamphlets, or other written materials from your doctor or pharmacy?: 1 - Never What is the last grade level you completed in school?: 12th grade  Interpreter Needed?: No  Information entered by :: NAllen LPN  Past Medical History:  Diagnosis Date  . Degenerative joint disease   . Diabetes mellitus without complication ([place])    "prediabetic"  . History of bronchitis    "a long time ago"  . Hyperlipidemia    takes Zetia and Atorvastatin daily  . Hypertension    takes Lisinopril-HCTZ daily  . Hypothyroidism    takes Synthroid daily  . Joint pain   . PONV (postoperative nausea and vomiting)   . Rheumatoid arthritis(714.0)    takes Plaquenil daily   Past Surgical History:  Procedure Laterality Date  . ABDOMINAL HYSTERECTOMY  1984  . APPENDECTOMY  60's  . CATARACT EXTRACTION Bilateral   . COLONOSCOPY    . THYROIDECTOMY, PARTIAL  at age 72  . TOTAL KNEE ARTHROPLASTY  10/17/2011   Procedure: TOTAL KNEE ARTHROPLASTY;  Surgeon: Ninetta Lights, MD;  Location: [facility];  Service: Orthopedics;  Laterality: Right;  120 MINUTES FOR THIS SURGERY  . TOTAL KNEE ARTHROPLASTY Left 01/12/2015  . TOTAL KNEE ARTHROPLASTY Left 01/12/2015   Procedure: TOTAL KNEE ARTHROPLASTY;  Surgeon: Kathryne Hitch, MD;  Location: [facility];  Service: Orthopedics;  Laterality: Left;   Family History  Problem Relation Age of Onset  . Asthma  Mother   . Heart attack Father   . Anesthesia problems Neg Hx    Social History   Socioeconomic History  . Marital status: Widowed    Spouse name: Not on file  . Number of children: Not on file  . Years of education: Not on file  . Highest education level: Not on file  Occupational History  . Occupation: retired  Scientific laboratory technician  . Financial resource strain: Not hard at all  . Food insecurity:    Worry: Never true    Inability: Never true  . Transportation needs:    Medical: No    Non-medical: No  Tobacco Use  . Smoking status: Former Smoker    Packs/day: 0.50     Years: 40.00    Pack years: 20.00  . Smokeless tobacco: Never Used  . Tobacco comment: quit smoiking 41yrs ago  Substance and Sexual Activity  . Alcohol use: No  . Drug use: No  . Sexual activity: Not Currently    Birth control/protection: Surgical  Lifestyle  . Physical activity:    Days per week: 0 days    Minutes per session: 0 min  . Stress: Not at all  Relationships  . Social connections:    Talks on phone: Not on file    Gets together: Not on file    Attends religious service: Not on file    Active member of club or organization: Not on file    Attends meetings of clubs or organizations: Not on file    Relationship status: Not on file  Other Topics Concern  . Not on file  Social History Narrative  . Not on file    Outpatient Encounter Medications as of 03/04/2019  Medication Sig  . Ascorbic Acid (VITAMIN C PO) Take 1 tablet by mouth daily.  Marland Kitchen aspirin 81 MG tablet Take 81 mg by mouth daily. Po qd (start 01-24-15)  . atorvastatin (LIPITOR) 40 MG tablet TAKE 1 TABLET DAILY  . cholecalciferol (VITAMIN D3) 25 MCG (1000 UT) tablet Take 1,000 Units by mouth daily.  Marland Kitchen ezetimibe (ZETIA) 10 MG tablet TAKE 1 TABLET EVERY DAY  . lisinopril-hydrochlorothiazide (PRINZIDE,ZESTORETIC) 10-12.5 MG tablet TAKE 1 TABLET DAILY  . omega-3 acid ethyl esters (LOVAZA) 1 g capsule Take by mouth 2 (two) times daily.  Marland Kitchen SYNTHROID 150 MCG tablet TAKE 1 TABLET DAILY  . Cholecalciferol (VITAMIN D3) 50000 UNITS CAPS Take 50,000 Units by mouth once a week.   No facility-administered encounter medications on file as of 03/04/2019.     Activities of Daily Living In your present state of health, do you have any difficulty performing the following activities: 03/04/2019  Hearing? N  Vision? N  Difficulty concentrating or making decisions? N  Walking or climbing stairs? N  Dressing or bathing? N  Doing errands, shopping? N  Preparing Food and eating ? N  Using the Toilet? N  In the past six months,  have you accidently leaked urine? N  Do you have problems with loss of bowel control? N  Managing your Medications? N  Managing your Finances? N  Housekeeping or managing your Housekeeping? N  Some recent data might be hidden    Patient Care Team: Glendale Chard, MD as PCP - General (Internal Medicine)    Assessment:   This is a routine wellness examination for Veera.  Exercise Activities and Dietary recommendations Current Exercise Habits: The patient does not participate in regular exercise at present  Goals    . Weight (lb) <  200 lb (90.7 kg)     Wants to weigh 180 pounds       Fall Risk Fall Risk  03/04/2019 09/10/2018 08/20/2018 04/23/2017  Falls in the past year? 0 0 0 No  Comment - - Emmi Telephone Survey: data to providers prior to load Emmi Telephone Survey: data to providers prior to load  Risk for fall due to : Medication side effect - - -  Follow up Falls prevention discussed;Education provided - - -   Is the patient's home free of loose throw rugs in walkways, pet beds, electrical cords, etc?   yes      Grab bars in the bathroom? yes      Handrails on the stairs?   yes      Adequate lighting?   yes  Timed Get Up and Go performed: n/a  Depression Screen PHQ 2/9 Scores 03/04/2019 09/10/2018  PHQ - 2 Score 0 0  PHQ- 9 Score 1 -     Cognitive Function     6CIT Screen 03/04/2019  What Year? 0 points  What month? 0 points  What time? 0 points  Count back from 20 0 points  Months in reverse 0 points  Repeat phrase 0 points  Total Score 0    Immunization History  Administered Date(s) Administered  . Influenza Split 10/20/2011  . Influenza-Unspecified 08/15/2018  . Pneumococcal-Unspecified 06/15/2018    Qualifies for Shingles Vaccine? yes  Screening Tests Health Maintenance  Topic Date Due  . DEXA SCAN  09/23/2004  . INFLUENZA VACCINE  05/02/2019  . TETANUS/TDAP  06/10/2022  . PNA vac Low Risk Adult  Completed    Cancer Screenings: Lung: Low  Dose CT Chest recommended if Age 25-80 years, 30 pack-year currently smoking OR have quit w/in 15years. Patient does not qualify. Breast:  Up to date on Mammogram? Yes   Up to date of Bone Density/Dexa? No Colorectal: not required  Additional Screenings: : Hepatitis C Screening: n/a     Plan:    Declined BMD. Wants to weigh 180 pounds   I have personally reviewed and noted the following in the patient's chart:   . Medical and social history . Use of alcohol, tobacco or illicit drugs  . Current medications and supplements . Functional ability and status . Nutritional status . Physical activity . Advanced directives . List of other physicians . Hospitalizations, surgeries, and ER visits in previous 12 months . Vitals . Screenings to include cognitive, depression, and falls . Referrals and appointments  In addition, I have reviewed and discussed with patient certain preventive protocols, quality metrics, and best practice recommendations. A written personalized care plan for preventive services as well as general preventive health recommendations were provided to patient.     Kellie Simmering, LPN  10/07/4942

## 2019-03-05 LAB — CMP14+EGFR
ALT: 16 IU/L (ref 0–32)
AST: 20 IU/L (ref 0–40)
Albumin/Globulin Ratio: 1.8 (ref 1.2–2.2)
Albumin: 4.4 g/dL (ref 3.7–4.7)
Alkaline Phosphatase: 155 IU/L — ABNORMAL HIGH (ref 39–117)
BUN/Creatinine Ratio: 14 (ref 12–28)
BUN: 14 mg/dL (ref 8–27)
Bilirubin Total: 0.6 mg/dL (ref 0.0–1.2)
CO2: 26 mmol/L (ref 20–29)
Calcium: 10.1 mg/dL (ref 8.7–10.3)
Chloride: 98 mmol/L (ref 96–106)
Creatinine, Ser: 0.99 mg/dL (ref 0.57–1.00)
GFR calc Af Amer: 63 mL/min/{1.73_m2} (ref >59)
GFR calc non Af Amer: 54 mL/min/{1.73_m2} — ABNORMAL LOW (ref >59)
Globulin, Total: 2.5 g/dL (ref 1.5–4.5)
Glucose: 83 mg/dL (ref 65–99)
Potassium: 4.3 mmol/L (ref 3.5–5.2)
Sodium: 138 mmol/L (ref 134–144)
Total Protein: 6.9 g/dL (ref 6.0–8.5)

## 2019-03-05 LAB — CBC
Hematocrit: 41 % (ref 34.0–46.6)
Hemoglobin: 13.1 g/dL (ref 11.1–15.9)
MCH: 29.4 pg (ref 26.6–33.0)
MCHC: 32 g/dL (ref 31.5–35.7)
MCV: 92 fL (ref 79–97)
Platelets: 322 10*3/uL (ref 150–450)
RBC: 4.45 x10E6/uL (ref 3.77–5.28)
RDW: 13.2 % (ref 11.7–15.4)
WBC: 5.7 10*3/uL (ref 3.4–10.8)

## 2019-03-05 LAB — TSH: TSH: 2.32 u[IU]/mL (ref 0.450–4.500)

## 2019-03-05 LAB — LIPID PANEL
Chol/HDL Ratio: 2.4 ratio (ref 0.0–4.4)
Cholesterol, Total: 183 mg/dL (ref 100–199)
HDL: 77 mg/dL (ref >39)
LDL Calculated: 97 mg/dL (ref 0–99)
Triglycerides: 47 mg/dL (ref 0–149)
VLDL Cholesterol Cal: 9 mg/dL (ref 5–40)

## 2019-03-05 LAB — HEMOGLOBIN A1C
Est. average glucose Bld gHb Est-mCnc: 120 mg/dL
Hgb A1c MFr Bld: 5.8 % — ABNORMAL HIGH (ref 4.8–5.6)

## 2019-03-05 LAB — T4, FREE: Free T4: 1.84 ng/dL — ABNORMAL HIGH (ref 0.82–1.77)

## 2019-03-08 NOTE — Progress Notes (Signed)
Subjective:     Patient ID: Hannah Castillo , female    DOB: 04/28/1941 , 78 y.o.   MRN: [id number]   Chief Complaint  Patient presents with  . Hypertension    HPI  Hypertension  This is a chronic problem. The current episode started more than 1 year ago. The problem has been gradually improving since onset. The problem is controlled. Pertinent negatives include no blurred vision, chest pain, palpitations or shortness of breath. Risk factors for coronary artery disease include dyslipidemia, obesity, post-menopausal state and sedentary lifestyle. Past treatments include ACE inhibitors and diuretics. The current treatment provides moderate improvement. Compliance problems include exercise.  Hypertensive end-organ damage includes kidney disease.     Past Medical History:  Diagnosis Date  . Degenerative joint disease   . Diabetes mellitus without complication ([place])    "prediabetic"  . History of bronchitis    "a long time ago"  . Hyperlipidemia    takes Zetia and Atorvastatin daily  . Hypertension    takes Lisinopril-HCTZ daily  . Hypothyroidism    takes Synthroid daily  . Joint pain   . PONV (postoperative nausea and vomiting)   . Rheumatoid arthritis(714.0)    takes Plaquenil daily     Family History  Problem Relation Age of Onset  . Asthma Mother   . Heart attack Father   . Anesthesia problems Neg Hx      Current Outpatient Medications:  .  Ascorbic Acid (VITAMIN C PO), Take 1 tablet by mouth daily., Disp: , Rfl:  .  aspirin 81 MG tablet, Take 81 mg by mouth daily. Po qd (start 01-24-15), Disp: , Rfl:  .  atorvastatin (LIPITOR) 40 MG tablet, TAKE 1 TABLET DAILY, Disp: 90 tablet, Rfl: 1 .  cholecalciferol (VITAMIN D3) 25 MCG (1000 UT) tablet, Take 1,000 Units by mouth daily., Disp: , Rfl:  .  Cholecalciferol (VITAMIN D3) 50000 UNITS CAPS, Take 50,000 Units by mouth once a week., Disp: , Rfl: 0 .  ezetimibe (ZETIA) 10 MG tablet, TAKE 1 TABLET EVERY DAY, Disp: 90  tablet, Rfl: 4 .  lisinopril-hydrochlorothiazide (PRINZIDE,ZESTORETIC) 10-12.5 MG tablet, TAKE 1 TABLET DAILY, Disp: 90 tablet, Rfl: 1 .  omega-3 acid ethyl esters (LOVAZA) 1 g capsule, Take by mouth 2 (two) times daily., Disp: , Rfl:  .  SYNTHROID 150 MCG tablet, TAKE 1 TABLET DAILY, Disp: 90 tablet, Rfl: 3   Allergies  Allergen Reactions  . Hydrocodone Nausea Only    MAKES PT FEEL VERY "SICK"     Review of Systems  Constitutional: Negative.   Eyes: Negative for blurred vision.  Respiratory: Negative.  Negative for shortness of breath.   Cardiovascular: Negative.  Negative for chest pain and palpitations.  Gastrointestinal: Negative.   Neurological: Negative.   Psychiatric/Behavioral: Negative.      Today's Vitals   03/04/19 1031  BP: (!) 142/84  Pulse: 99  Temp: 98.8 F (37.1 C)  TempSrc: Oral  Weight: 200 lb (90.7 kg)  Height: 5' 2.4" (1.585 m)  PainSc: 0-No pain   Body mass index is 36.11 kg/m.   Objective:  Physical Exam Vitals signs and nursing note reviewed.  Constitutional:      Appearance: Normal appearance.  HENT:     Head: Normocephalic and atraumatic.  Cardiovascular:     Rate and Rhythm: Normal rate and regular rhythm.     Heart sounds: Normal heart sounds.  Pulmonary:     Effort: Pulmonary effort is normal.     Breath  sounds: Normal breath sounds.  Skin:    General: Skin is warm.  Neurological:     General: No focal deficit present.     Mental Status: She is alert.  Psychiatric:        Mood and Affect: Mood normal.        Behavior: Behavior normal.         Assessment And Plan:     1. Hypertensive nephropathy  Fair control. She will continue with current meds. She is encouraged to avoid adding salt to his foods. She will rto in six months for re-evaluation.   - CMP14+EGFR - CBC no Diff  2. Chronic renal disease, stage II  Chronic. I will check GFR, Cr today. She is encouraged to stay well hydrated.   3. Primary hypothyroidism  I  will check thyroid panel and adjust meds as needed.  - TSH - T4, Free  4. Pure hypercholesterolemia  I will check a fasting lipid panel and LFTs. She is encouraged to avoid fried foods, increase daily activity and increase her fish intake. She is encouraged to continue with current meds.   - Lipid panel  5. Other abnormal glucose  HER A1C HAS BEEN ELEVATED IN THE PAST. I WILL CHECK AN A1C, BMET TODAY. SHE WAS ENCOURAGED TO AVOID SUGARY BEVERAGES AND PROCESSED FOODS INCLUDNG BREADS, RICE AND PASTA.  - Hemoglobin A1c   Maximino Greenland, MD    THE PATIENT IS ENCOURAGED TO PRACTICE SOCIAL DISTANCING DUE TO THE COVID-19 PANDEMIC.

## 2019-03-16 ENCOUNTER — Other Ambulatory Visit: Payer: Self-pay | Admitting: Internal Medicine

## 2019-04-22 ENCOUNTER — Other Ambulatory Visit: Payer: Self-pay | Admitting: Internal Medicine

## 2019-06-10 ENCOUNTER — Other Ambulatory Visit: Payer: Self-pay

## 2019-06-10 ENCOUNTER — Ambulatory Visit: Payer: Medicare Other

## 2019-06-10 DIAGNOSIS — E039 Hypothyroidism, unspecified: Secondary | ICD-10-CM | POA: Diagnosis not present

## 2019-06-11 LAB — TSH: TSH: 2.18 u[IU]/mL (ref 0.450–4.500)

## 2019-06-11 LAB — T4, FREE: Free T4: 1.82 ng/dL — ABNORMAL HIGH (ref 0.82–1.77)

## 2019-08-21 ENCOUNTER — Other Ambulatory Visit: Payer: Self-pay | Admitting: Internal Medicine

## 2019-08-25 ENCOUNTER — Other Ambulatory Visit: Payer: Self-pay | Admitting: Internal Medicine

## 2019-08-25 DIAGNOSIS — Z1231 Encounter for screening mammogram for malignant neoplasm of breast: Secondary | ICD-10-CM

## 2019-09-07 ENCOUNTER — Encounter: Payer: Self-pay | Admitting: Internal Medicine

## 2019-09-07 ENCOUNTER — Ambulatory Visit (INDEPENDENT_AMBULATORY_CARE_PROVIDER_SITE_OTHER): Payer: Medicare Other | Admitting: Internal Medicine

## 2019-09-07 ENCOUNTER — Other Ambulatory Visit: Payer: Self-pay

## 2019-09-07 VITALS — BP 120/78 | HR 85 | Temp 98.0°F | Ht 62.4 in | Wt 198.6 lb

## 2019-09-07 DIAGNOSIS — R7309 Other abnormal glucose: Secondary | ICD-10-CM

## 2019-09-07 DIAGNOSIS — E2839 Other primary ovarian failure: Secondary | ICD-10-CM | POA: Diagnosis not present

## 2019-09-07 DIAGNOSIS — I129 Hypertensive chronic kidney disease with stage 1 through stage 4 chronic kidney disease, or unspecified chronic kidney disease: Secondary | ICD-10-CM

## 2019-09-07 DIAGNOSIS — E039 Hypothyroidism, unspecified: Secondary | ICD-10-CM | POA: Diagnosis not present

## 2019-09-07 NOTE — Progress Notes (Signed)
This visit occurred during the SARS-CoV-2 public health emergency.  Safety protocols were in place, including screening questions prior to the visit, additional usage of staff PPE, and extensive cleaning of exam room while observing appropriate contact time as indicated for disinfecting solutions.  Subjective:     Patient ID: Hannah Castillo , female    DOB: [birth year] , 78 y.o.   MRN: [id number]   Chief Complaint  Patient presents with  . Hypertension  . Hypothyroidism    HPI  Hypertension This is a chronic problem. The current episode started more than 1 year ago. The problem has been gradually improving since onset. The problem is controlled. Pertinent negatives include no blurred vision, chest pain, palpitations or shortness of breath. Risk factors for coronary artery disease include obesity, sedentary lifestyle and post-menopausal state. Past treatments include ACE inhibitors and diuretics. The current treatment provides moderate improvement. Compliance problems include exercise.  Hypertensive end-organ damage includes kidney disease. Identifiable causes of hypertension include a thyroid problem.  Thyroid Problem Presents for follow-up visit. Patient reports no constipation, depressed mood, diaphoresis or palpitations. The symptoms have been stable.     Past Medical History:  Diagnosis Date  . Degenerative joint disease   . Diabetes mellitus without complication ([place])    "prediabetic"  . History of bronchitis    "a long time ago"  . Hyperlipidemia    takes Zetia and Atorvastatin daily  . Hypertension    takes Lisinopril-HCTZ daily  . Hypothyroidism    takes Synthroid daily  . Joint pain   . PONV (postoperative nausea and vomiting)   . Rheumatoid arthritis(714.0)    takes Plaquenil daily     Family History  Problem Relation Age of Onset  . Asthma Mother   . Heart attack Father   . Anesthesia problems Neg Hx      Current Outpatient Medications:  .  Ascorbic  Acid (VITAMIN C PO), Take 1 tablet by mouth daily., Disp: , Rfl:  .  aspirin 81 MG tablet, Take 81 mg by mouth daily. Po qd (start 01-24-15), Disp: , Rfl:  .  atorvastatin (LIPITOR) 40 MG tablet, TAKE 1 TABLET DAILY, Disp: 90 tablet, Rfl: 3 .  Cholecalciferol (VITAMIN D) 125 MCG (5000 UT) CAPS, Take 1,000 Units by mouth daily. , Disp: , Rfl:  .  ezetimibe (ZETIA) 10 MG tablet, TAKE 1 TABLET EVERY DAY, Disp: 90 tablet, Rfl: 4 .  lisinopril-hydrochlorothiazide (ZESTORETIC) 10-12.5 MG tablet, TAKE 1 TABLET DAILY, Disp: 90 tablet, Rfl: 3 .  omega-3 acid ethyl esters (LOVAZA) 1 g capsule, Take by mouth 2 (two) times daily., Disp: , Rfl:  .  SYNTHROID 150 MCG tablet, TAKE 1 TABLET DAILY (Patient taking differently: Take 1 tablet by mouth Monday - Saturday), Disp: 90 tablet, Rfl: 3   Allergies  Allergen Reactions  . Hydrocodone Nausea Only    MAKES PT FEEL VERY "SICK"     Review of Systems  Constitutional: Negative.  Negative for diaphoresis.  Eyes: Negative for blurred vision.  Respiratory: Negative.  Negative for shortness of breath.   Cardiovascular: Negative.  Negative for chest pain and palpitations.  Gastrointestinal: Negative.  Negative for constipation.  Neurological: Negative.   Psychiatric/Behavioral: Negative.      Today's Vitals   09/07/19 1055  BP: 120/78  Pulse: 85  Temp: 98 F (36.7 C)  TempSrc: Oral  Weight: 198 lb 9.6 oz (90.1 kg)  Height: 5' 2.4" (1.585 m)  PainSc: 0-No pain   Body mass index is  35.86 kg/m.   Objective:  Physical Exam Vitals signs and nursing note reviewed.  Constitutional:      Appearance: Normal appearance.  HENT:     Head: Normocephalic and atraumatic.  Cardiovascular:     Rate and Rhythm: Normal rate and regular rhythm.     Heart sounds: Normal heart sounds.  Pulmonary:     Effort: Pulmonary effort is normal.     Breath sounds: Normal breath sounds.  Skin:    General: Skin is warm.  Neurological:     General: No focal deficit  present.     Mental Status: She is alert.  Psychiatric:        Mood and Affect: Mood normal.        Behavior: Behavior normal.         Assessment And Plan:     1. Hypertensive nephropathy  Chronic, well controlled. She will continue with current meds. She is encouraged to avoid adding salt to her foods.   - BMP8+EGFR  2. Primary hypothyroidism  Previous lab results were reviewed in full detail. No need for repeat bloodwork at this time. She will continue with her current regimen.   3. Other abnormal glucose  HER A1C HAS BEEN ELEVATED IN THE PAST. I WILL CHECK AN A1C, BMET TODAY. SHE WAS ENCOURAGED TO AVOID SUGARY BEVERAGES AND PROCESSED FOODS INCLUDNG BREADS, RICE AND PASTA.  - Hemoglobin A1c  4. 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 She is encouraged to start slowly - start with 10 minutes twice daily at least three to four days per week and to gradually build to 30 minutes five days weekly. She was given tips to incorporate more activity into her daily routine - take stairs when possible, park farther away from grocery stores, etc.    5. Estrogen deficiency  She declines referral for bone density. She does not wish to have testing b/c she does not plan on accepting the treatment. She has not tolerated medications in the past, and she does not wish to try any new medications either. She is encouraged to walk 20 - 30 minutes four to five days per week and to comply with calcium/vitamin D supplementation.   Maximino Greenland, MD    THE PATIENT IS ENCOURAGED TO PRACTICE SOCIAL DISTANCING DUE TO THE COVID-19 PANDEMIC.

## 2019-09-08 LAB — BMP8+EGFR
BUN/Creatinine Ratio: 14 (ref 12–28)
BUN: 14 mg/dL (ref 8–27)
CO2: 25 mmol/L (ref 20–29)
Calcium: 10.3 mg/dL (ref 8.7–10.3)
Chloride: 97 mmol/L (ref 96–106)
Creatinine, Ser: 0.99 mg/dL (ref 0.57–1.00)
GFR calc Af Amer: 63 mL/min/{1.73_m2} (ref >59)
GFR calc non Af Amer: 54 mL/min/{1.73_m2} — ABNORMAL LOW (ref >59)
Glucose: 91 mg/dL (ref 65–99)
Potassium: 4.1 mmol/L (ref 3.5–5.2)
Sodium: 136 mmol/L (ref 134–144)

## 2019-09-08 LAB — HEMOGLOBIN A1C
Est. average glucose Bld gHb Est-mCnc: 114 mg/dL
Hgb A1c MFr Bld: 5.6 % (ref 4.8–5.6)

## 2019-09-26 ENCOUNTER — Other Ambulatory Visit: Payer: Self-pay | Admitting: Internal Medicine

## 2019-10-20 ENCOUNTER — Ambulatory Visit: Payer: Medicare Other

## 2019-10-30 ENCOUNTER — Ambulatory Visit: Payer: Medicare Other

## 2019-11-09 DIAGNOSIS — M7582 Other shoulder lesions, left shoulder: Secondary | ICD-10-CM | POA: Diagnosis not present

## 2019-11-24 ENCOUNTER — Ambulatory Visit: Payer: Medicare Other

## 2019-12-20 ENCOUNTER — Other Ambulatory Visit: Payer: Self-pay | Admitting: Internal Medicine

## 2020-01-12 ENCOUNTER — Ambulatory Visit
Admission: RE | Admit: 2020-01-12 | Discharge: 2020-01-12 | Disposition: A | Discharge status: Home / Self Care | Payer: Medicare Other | Source: Ambulatory Visit | Attending: Internal Medicine | Admitting: Internal Medicine

## 2020-01-12 ENCOUNTER — Other Ambulatory Visit: Payer: Self-pay

## 2020-01-12 DIAGNOSIS — Z1231 Encounter for screening mammogram for malignant neoplasm of breast: Secondary | ICD-10-CM | POA: Diagnosis not present

## 2020-03-09 ENCOUNTER — Other Ambulatory Visit: Payer: Self-pay

## 2020-03-09 ENCOUNTER — Ambulatory Visit: Payer: Medicare Other | Admitting: Internal Medicine

## 2020-03-09 ENCOUNTER — Ambulatory Visit (INDEPENDENT_AMBULATORY_CARE_PROVIDER_SITE_OTHER): Payer: Medicare Other

## 2020-03-09 ENCOUNTER — Ambulatory Visit (INDEPENDENT_AMBULATORY_CARE_PROVIDER_SITE_OTHER): Payer: Medicare Other | Admitting: Internal Medicine

## 2020-03-09 ENCOUNTER — Encounter: Payer: Self-pay | Admitting: Internal Medicine

## 2020-03-09 VITALS — BP 124/82 | HR 81 | Temp 98.0°F | Ht 62.0 in | Wt 202.8 lb

## 2020-03-09 VITALS — BP 124/82 | HR 81 | Temp 98.0°F | Ht 62.4 in | Wt 202.0 lb

## 2020-03-09 DIAGNOSIS — N182 Chronic kidney disease, stage 2 (mild): Secondary | ICD-10-CM | POA: Diagnosis not present

## 2020-03-09 DIAGNOSIS — R7309 Other abnormal glucose: Secondary | ICD-10-CM

## 2020-03-09 DIAGNOSIS — Z6836 Body mass index (BMI) 36.0-36.9, adult: Secondary | ICD-10-CM

## 2020-03-09 DIAGNOSIS — Z0000 Encounter for general adult medical examination without abnormal findings: Secondary | ICD-10-CM | POA: Diagnosis not present

## 2020-03-09 DIAGNOSIS — I129 Hypertensive chronic kidney disease with stage 1 through stage 4 chronic kidney disease, or unspecified chronic kidney disease: Secondary | ICD-10-CM | POA: Diagnosis not present

## 2020-03-09 DIAGNOSIS — Z79899 Other long term (current) drug therapy: Secondary | ICD-10-CM

## 2020-03-09 DIAGNOSIS — E559 Vitamin D deficiency, unspecified: Secondary | ICD-10-CM

## 2020-03-09 DIAGNOSIS — E039 Hypothyroidism, unspecified: Secondary | ICD-10-CM | POA: Diagnosis not present

## 2020-03-09 NOTE — Patient Instructions (Signed)
Mediterranean Diet A Mediterranean diet refers to food and lifestyle choices that are based on the traditions of countries located on the [place]. This way of eating has been shown to help prevent certain conditions and improve outcomes for people who have chronic diseases, like kidney disease and heart disease. What are tips for following this plan? Lifestyle  Cook and eat meals together with your family, when possible.  Drink enough fluid to keep your urine clear or pale yellow.  Be physically active every day. This includes: ? Aerobic exercise like running or swimming. ? Leisure activities like gardening, walking, or housework.  Get 7-8 hours of sleep each night.  If recommended by your health care provider, drink red wine in moderation. This means 1 glass a day for nonpregnant women and 2 glasses a day for men. A glass of wine equals 5 oz (150 mL). Reading food labels  Check the serving size of packaged foods. For foods such as rice and pasta, the serving size refers to the amount of cooked product, not dry.  Check the total fat in packaged foods. Avoid foods that have saturated fat or trans fats.  Check the ingredients list for added sugars, such as corn syrup.   Shopping  At the grocery store, buy most of your food from the areas near the walls of the store. This includes: ? Fresh fruits and vegetables (produce). ? Grains, beans, nuts, and seeds. Some of these may be available in unpackaged forms or large amounts (in bulk). ? Fresh seafood. ? Poultry and eggs. ? Low-fat dairy products.  Buy whole ingredients instead of prepackaged foods.  Buy fresh fruits and vegetables in-season from local farmers markets.  Buy frozen fruits and vegetables in resealable bags.  If you do not have access to quality fresh seafood, buy precooked frozen shrimp or canned fish, such as tuna, salmon, or sardines.  Buy small amounts of raw or cooked vegetables, salads, or olives from  the deli or salad bar at your store.  Stock your pantry so you always have certain foods on hand, such as olive oil, canned tuna, canned tomatoes, rice, pasta, and beans. Cooking  Cook foods with extra-virgin olive oil instead of using butter or other vegetable oils.  Have meat as a side dish, and have vegetables or grains as your main dish. This means having meat in small portions or adding small amounts of meat to foods like pasta or stew.  Use beans or vegetables instead of meat in common dishes like chili or lasagna.  Experiment with different cooking methods. Try roasting or broiling vegetables instead of steaming or sauteing them.  Add frozen vegetables to soups, stews, pasta, or rice.  Add nuts or seeds for added healthy fat at each meal. You can add these to yogurt, salads, or vegetable dishes.  Marinate fish or vegetables using olive oil, lemon juice, garlic, and fresh herbs. Meal planning  Plan to eat 1 vegetarian meal one day each week. Try to work up to 2 vegetarian meals, if possible.  Eat seafood 2 or more times a week.  Have healthy snacks readily available, such as: ? Vegetable sticks with hummus. ? Greek yogurt. ? Fruit and nut trail mix.  Eat balanced meals throughout the week. This includes: ? Fruit: 2-3 servings a day ? Vegetables: 4-5 servings a day ? Low-fat dairy: 2 servings a day ? Fish, poultry, or lean meat: 1 serving a day ? Beans and legumes: 2 or more servings a week ?   Nuts and seeds: 1-2 servings a day ? Whole grains: 6-8 servings a day ? Extra-virgin olive oil: 3-4 servings a day  Limit red meat and sweets to only a few servings a month   What are my food choices?  Mediterranean diet ? Recommended  Grains: Whole-grain pasta. Brown rice. Bulgar wheat. Polenta. Couscous. Whole-wheat bread. Oatmeal. Quinoa.  Vegetables: Artichokes. Beets. Broccoli. Cabbage. Carrots. Eggplant. Green beans. Chard. Kale. Spinach. Onions. Leeks. Peas. Squash.  Tomatoes. Peppers. Radishes.  Fruits: Apples. Apricots. Avocado. Berries. Bananas. Cherries. Dates. Figs. Grapes. Lemons. Melon. Oranges. Peaches. Plums. Pomegranate.  Meats and other protein foods: Beans. Almonds. Sunflower seeds. Pine nuts. Peanuts. Cod. Salmon. Scallops. Shrimp. Tuna. Tilapia. Clams. Oysters. Eggs.  Dairy: Low-fat milk. Cheese. Greek yogurt.  Beverages: Water. Red wine. Herbal tea.  Fats and oils: Extra virgin olive oil. Avocado oil. Grape seed oil.  Sweets and desserts: Greek yogurt with honey. Baked apples. Poached pears. Trail mix.  Seasoning and other foods: Basil. Cilantro. Coriander. Cumin. Mint. Parsley. Sage. Rosemary. Tarragon. Garlic. Oregano. Thyme. Pepper. Balsalmic vinegar. Tahini. Hummus. Tomato sauce. Olives. Mushrooms. ? Limit these  Grains: Prepackaged pasta or rice dishes. Prepackaged cereal with added sugar.  Vegetables: Deep fried potatoes (french fries).  Fruits: Fruit canned in syrup.  Meats and other protein foods: Beef. Pork. Lamb. Poultry with skin. Hot dogs. Bacon.  Dairy: Ice cream. Sour cream. Whole milk.  Beverages: Juice. Sugar-sweetened soft drinks. Beer. Liquor and spirits.  Fats and oils: Butter. Canola oil. Vegetable oil. Beef fat (tallow). Lard.  Sweets and desserts: Cookies. Cakes. Pies. Candy.  Seasoning and other foods: Mayonnaise. Premade sauces and marinades. The items listed may not be a complete list. Talk with your dietitian about what dietary choices are right for you. Summary  The Mediterranean diet includes both food and lifestyle choices.  Eat a variety of fresh fruits and vegetables, beans, nuts, seeds, and whole grains.  Limit the amount of red meat and sweets that you eat.  Talk with your health care provider about whether it is safe for you to drink red wine in moderation. This means 1 glass a day for nonpregnant women and 2 glasses a day for men. A glass of wine equals 5 oz (150 mL). This information  is not intended to replace advice given to you by your health care provider. Make sure you discuss any questions you have with your health care provider. Document Revised: 05/17/2016 Document Reviewed: 05/10/2016 Elsevier Patient Education  2020 Elsevier Inc[employer]  

## 2020-03-09 NOTE — Progress Notes (Signed)
This visit occurred during the SARS-CoV-2 public health emergency.  Safety protocols were in place, including screening questions prior to the visit, additional usage of staff PPE, and extensive cleaning of exam room while observing appropriate contact time as indicated for disinfecting solutions.  Subjective:   Hannah Castillo is a 79 y.o. female who presents for Medicare Annual (Subsequent) preventive examination.  Review of Systems:  n/a Cardiac Risk Factors include: advanced age (>11men, >74 women);hypertension;obesity (BMI >30kg/m2);sedentary lifestyle     Objective:     Vitals: BP 124/82 (BP Location: Left Arm, Patient Position: Sitting, Cuff Size: Normal)   Pulse 81   Temp 98 F (36.7 C) (Oral)   Ht 5\' 2"  (1.575 m)   Wt 202 lb 12.8 oz (92 kg)   SpO2 96%   BMI 37.09 kg/m   Body mass index is 37.09 kg/m.  Advanced Directives 03/09/2020 03/04/2019 01/13/2015 12/30/2014 10/17/2011 10/12/2011 10/12/2011  Does Patient Have a Medical Advance Directive? Yes Yes Yes Yes Patient would like information Patient would like information Patient does not have advance directive;Patient would not like information  Type of Scientist, forensic Power of [place];Living will [place];Living will Healthcare Power of [place] - - -  Does patient want to make changes to medical advance directive? - - No - Patient declined - - - -  Copy of [place] in Chart? No - copy requested No - copy requested No - copy requested No - copy requested - - -  Would patient like information on creating a medical advance directive? - - - - Advance directive packet given Advance directive packet given -  Pre-existing out of facility DNR order (yellow form or pink MOST form) - - - - No - No    Tobacco Social History   Tobacco Use  Smoking Status Former Smoker  . Packs/day: 0.50  . Years: 40.00  . Pack years: 20.00  Smokeless Tobacco Never  Used  Tobacco Comment   quit smoiking 59yrs ago     Counseling given: Not Answered Comment: quit smoiking 82yrs ago   Clinical Intake:  Pre-visit preparation completed: Yes  Pain : No/denies pain     Nutritional Status: BMI > 30  Obese Nutritional Risks: None Diabetes: No  How often do you need to have someone help you when you read instructions, pamphlets, or other written materials from your doctor or pharmacy?: 1 - Never What is the last grade level you completed in school?: 12th grade  Interpreter Needed?: No  Information entered by :: NAllen LPN  Past Medical History:  Diagnosis Date  . Degenerative joint disease   . Diabetes mellitus without complication ([place])    "prediabetic"  . History of bronchitis    "a long time ago"  . Hyperlipidemia    takes Zetia and Atorvastatin daily  . Hypertension    takes Lisinopril-HCTZ daily  . Hypothyroidism    takes Synthroid daily  . Joint pain   . PONV (postoperative nausea and vomiting)   . Rheumatoid arthritis(714.0)    takes Plaquenil daily   Past Surgical History:  Procedure Laterality Date  . ABDOMINAL HYSTERECTOMY  1984  . APPENDECTOMY  60's  . CATARACT EXTRACTION Bilateral   . COLONOSCOPY    . THYROIDECTOMY, PARTIAL  at age 45  . 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 TOTAL KNEE ARTHROPLASTY Left 01/12/2015   Procedure: TOTAL KNEE ARTHROPLASTY;  Surgeon: Kathryne Hitch, MD;  Location: [facility];  Service: Orthopedics;  Laterality: Left;   Family History  Problem Relation Age of Onset  . Asthma Mother   . Heart attack Father   . Anesthesia problems Neg Hx    Social History   Socioeconomic History  . Marital status: Widowed    Spouse name: Not on file  . Number of children: Not on file  . Years of education:  Not on file  . Highest education level: Not on file  Occupational History  . Occupation: retired  Tobacco Use  . Smoking status: Former Smoker    Packs/day: 0.50    Years: 40.00    Pack years: 20.00  . Smokeless tobacco: Never Used  . Tobacco comment: quit smoiking 42yrs ago  Substance and Sexual Activity  . Alcohol use: No  . Drug use: No  . Sexual activity: Not Currently    Birth control/protection: Surgical  Other Topics Concern  . Not on file  Social History Narrative  . Not on file   Social Determinants of Health   Financial Resource Strain: Low Risk   . Difficulty of Paying Living Expenses: Not hard at all  Food Insecurity: No Food Insecurity  . Worried About Charity fundraiser in the Last Year: Never true  . Ran Out of Food in the Last Year: Never true  Transportation Needs: No Transportation Needs  . Lack of Transportation (Medical): No  . Lack of Transportation (Non-Medical): No  Physical Activity: Insufficiently Active  . Days of Exercise per Week: 2 days  . Minutes of Exercise per Session: 30 min  Stress: No Stress Concern Present  . Feeling of Stress : Not at all  Social Connections:   . Frequency of Communication with Friends and Family:   . Frequency of Social Gatherings with Friends and Family:   . Attends Religious Services:   . Active Member of Clubs or Organizations:   . Attends Archivist Meetings:   Marland Kitchen Marital Status:     Outpatient Encounter Medications as of 03/09/2020  Medication Sig  . Ascorbic Acid (VITAMIN C PO) Take 1 tablet by mouth daily.  Marland Kitchen aspirin 81 MG tablet Take 81 mg by mouth daily. Po qd (start 01-24-15)  . atorvastatin (LIPITOR) 40 MG tablet TAKE 1 TABLET DAILY  . Cholecalciferol (VITAMIN D) 125 MCG (5000 UT) CAPS Take 1,000 Units by mouth daily.   Marland Kitchen ezetimibe (ZETIA) 10 MG tablet TAKE 1 TABLET EVERY DAY  . lisinopril-hydrochlorothiazide (ZESTORETIC) 10-12.5 MG tablet TAKE 1 TABLET DAILY  . omega-3 acid ethyl esters  (LOVAZA) 1 g capsule Take by mouth 2 (two) times daily.  Marland Kitchen SYNTHROID 150 MCG tablet Take 1 tablet by mouth Monday - Saturday and 1/2 tablet on sundays   No facility-administered encounter medications on file as of 03/09/2020.    Activities of Daily Living In your present state of health, do you have any difficulty performing the following activities: 03/09/2020  Hearing? N  Vision? N  Difficulty concentrating or making decisions? N  Walking or climbing stairs? N  Dressing or bathing? N  Doing errands, shopping? N  Preparing Food and eating ? N  Using the Toilet? N  In the past six months, have you accidently leaked urine? N  Do you have problems with loss of bowel control? N  Managing your Medications? N  Managing your Finances? N  Housekeeping or managing your Housekeeping? N  Some recent data might be hidden    Patient Care Team: Glendale Chard, MD as PCP - General (Internal Medicine)    Assessment:   This is a routine wellness examination for Brea.  Exercise Activities and Dietary recommendations Current Exercise Habits: Home exercise routine, Type of exercise: yoga;walking, Time (Minutes): 30, Frequency (Times/Week): 2, Weekly Exercise (Minutes/Week): 60  Goals    . Weight (lb) < 200 lb (90.7 kg)     Wants to weigh 180 pounds    . Weight (lb) < 200 lb (90.7 kg)     03/09/2020, wants to weigh 180 pounds       Fall Risk Fall Risk  03/09/2020 09/07/2019 03/04/2019 09/10/2018 08/20/2018  Falls in the past year? 0 0 0 0 0  Comment - - - - Emmi Telephone Survey: data to providers prior to load  Risk for fall due to : Medication side effect - Medication side effect - -  Follow up Falls evaluation completed;Education provided;Falls prevention discussed - Falls prevention discussed;Education provided - -   Is the patient's home free of loose throw rugs in walkways, pet beds, electrical cords, etc?   yes      Grab bars in the bathroom? yes      Handrails on the stairs?   yes       Adequate lighting?   yes  Timed Get Up and Go performed: n/a  Depression Screen PHQ 2/9 Scores 03/09/2020 03/04/2019 09/10/2018  PHQ - 2 Score 0 0 0  PHQ- 9 Score 0 1 -     Cognitive Function     6CIT Screen 03/09/2020 03/04/2019  What Year? 0 points 0 points  What month? 0 points 0 points  What time? 0 points 0 points  Count back from 20 2 points 0 points  Months in reverse 0 points 0 points  Repeat phrase 0 points 0 points  Total Score 2 0    Immunization History  Administered Date(s) Administered  . Fluad Quad(high Dose 65+) 07/10/2019  . Influenza Split 10/20/2011  . Influenza-Unspecified 08/15/2018  . PFIZER SARS-COV-2 Vaccination 11/05/2019, 11/26/2019  . Pneumococcal-Unspecified 06/15/2018    Qualifies for Shingles Vaccine? yes  Screening Tests Health Maintenance  Topic Date Due  . DEXA SCAN  09/06/2020 (Originally 09/23/2004)  . INFLUENZA VACCINE  05/01/2020  . TETANUS/TDAP  06/10/2022  . COVID-19 Vaccine  Completed  . PNA vac Low Risk Adult  Completed    Cancer Screenings: Lung: Low Dose CT Chest recommended if Age 40-80 years, 30 pack-year currently smoking OR have quit w/in 15years. Patient does not qualify. Breast:  Up to date on Mammogram? Yes   Up to date of Bone Density/Dexa? Yes Colorectal: not required  Additional Screenings: : Hepatitis C Screening: n/a     Plan:    Patient wants to weigh 180 pounds.   I have personally reviewed and noted the following in the patient's chart:   . Medical and social history . Use of alcohol, tobacco or illicit drugs  . Current medications and supplements . Functional ability and status . Nutritional status . Physical activity . Advanced directives . List of other physicians . Hospitalizations, surgeries, and ER visits in previous 12 months . Vitals . Screenings to include cognitive, depression, and falls . Referrals and appointments  In addition, I have reviewed and discussed with patient certain  preventive protocols, quality metrics, and best practice  recommendations. A written personalized care plan for preventive services as well as general preventive health recommendations were provided to patient.     Kellie Simmering, LPN  06/09/7181

## 2020-03-09 NOTE — Patient Instructions (Signed)
Hannah Castillo , Thank you for taking time to come for your Medicare Wellness Visit. I appreciate your ongoing commitment to your health goals. Please review the following plan we discussed and let me know if I can assist you in the future.   Screening recommendations/referrals: Colonoscopy: not required Mammogram: 12/2019 Bone Density: declines Recommended yearly ophthalmology/optometry visit for glaucoma screening and checkup Recommended yearly dental visit for hygiene and checkup  Vaccinations: Influenza vaccine: 07/2019 Pneumococcal vaccine: 06/2018 Tdap vaccine: 06/2012 Shingles vaccine: discussed    Advanced directives: Please bring a copy of your POA (Power of [place]) and/or Living Will to your next appointment.   Conditions/risks identified: obesity  Next appointment: 03/16/2021 at 9:30   Preventive Care 81 Years and Older, Female Preventive care refers to lifestyle choices and visits with your health care provider that can promote health and wellness. What does preventive care include?  A yearly physical exam. This is also called an annual well check.  Dental exams once or twice a year.  Routine eye exams. Ask your health care provider how often you should have your eyes checked.  Personal lifestyle choices, including:  Daily care of your teeth and gums.  Regular physical activity.  Eating a healthy diet.  Avoiding tobacco and drug use.  Limiting alcohol use.  Practicing safe sex.  Taking low-dose aspirin every day.  Taking vitamin and mineral supplements as recommended by your health care provider. What happens during an annual well check? The services and screenings done by your health care provider during your annual well check will depend on your age, overall health, lifestyle risk factors, and family history of disease. Counseling  Your health care provider may ask you questions about your:  Alcohol use.  Tobacco use.  Drug use.  Emotional  well-being.  Home and relationship well-being.  Sexual activity.  Eating habits.  History of falls.  Memory and ability to understand (cognition).  Work and work Statistician.  Reproductive health. Screening  You may have the following tests or measurements:  Height, weight, and BMI.  Blood pressure.  Lipid and cholesterol levels. These may be checked every 5 years, or more frequently if you are over 26 years old.  Skin check.  Lung cancer screening. You may have this screening every year starting at age 83 if you have a 30-pack-year history of smoking and currently smoke or have quit within the past 15 years.  Fecal occult blood test (FOBT) of the stool. You may have this test every year starting at age 62.  Flexible sigmoidoscopy or colonoscopy. You may have a sigmoidoscopy every 5 years or a colonoscopy every 10 years starting at age 54.  Hepatitis C blood test.  Hepatitis B blood test.  Sexually transmitted disease (STD) testing.  Diabetes screening. This is done by checking your blood sugar (glucose) after you have not eaten for a while (fasting). You may have this done every 1-3 years.  Bone density scan. This is done to screen for osteoporosis. You may have this done starting at age 28.  Mammogram. This may be done every 1-2 years. Talk to your health care provider about how often you should have regular mammograms. Talk with your health care provider about your test results, treatment options, and if necessary, the need for more tests. Vaccines  Your health care provider may recommend certain vaccines, such as:  Influenza vaccine. This is recommended every year.  Tetanus, diphtheria, and acellular pertussis (Tdap, Td) vaccine. You may need a Td booster  every 10 years.  Zoster vaccine. You may need this after age 80.  Pneumococcal 13-valent conjugate (PCV13) vaccine. One dose is recommended after age 88.  Pneumococcal polysaccharide (PPSV23) vaccine. One  dose is recommended after age 62. Talk to your health care provider about which screenings and vaccines you need and how often you need them. This information is not intended to replace advice given to you by your health care provider. Make sure you discuss any questions you have with your health care provider. Document Released: 10/14/2015 Document Revised: 06/06/2016 Document Reviewed: 07/19/2015 Elsevier Interactive Patient Education  2017 [place] Prevention in the Home Falls can cause injuries. They can happen to people of all ages. There are many things you can do to make your home safe and to help prevent falls. What can I do on the outside of my home?  Regularly fix the edges of walkways and driveways and fix any cracks.  Remove anything that might make you trip as you walk through a door, such as a raised step or threshold.  Trim any bushes or trees on the path to your home.  Use bright outdoor lighting.  Clear any walking paths of anything that might make someone trip, such as rocks or tools.  Regularly check to see if handrails are loose or broken. Make sure that both sides of any steps have handrails.  Any raised decks and porches should have guardrails on the edges.  Have any leaves, snow, or ice cleared regularly.  Use sand or salt on walking paths during winter.  Clean up any spills in your garage right away. This includes oil or grease spills. What can I do in the bathroom?  Use night lights.  Install grab bars by the toilet and in the tub and shower. Do not use towel bars as grab bars.  Use non-skid mats or decals in the tub or shower.  If you need to sit down in the shower, use a plastic, non-slip stool.  Keep the floor dry. Clean up any water that spills on the floor as soon as it happens.  Remove soap buildup in the tub or shower regularly.  Attach bath mats securely with double-sided non-slip rug tape.  Do not have throw rugs and other  things on the floor that can make you trip. What can I do in the bedroom?  Use night lights.  Make sure that you have a light by your bed that is easy to reach.  Do not use any sheets or blankets that are too big for your bed. They should not hang down onto the floor.  Have a firm chair that has side arms. You can use this for support while you get dressed.  Do not have throw rugs and other things on the floor that can make you trip. What can I do in the kitchen?  Clean up any spills right away.  Avoid walking on wet floors.  Keep items that you use a lot in easy-to-reach places.  If you need to reach something above you, use a strong step stool that has a grab bar.  Keep electrical cords out of the way.  Do not use floor polish or wax that makes floors slippery. If you must use wax, use non-skid floor wax.  Do not have throw rugs and other things on the floor that can make you trip. What can I do with my stairs?  Do not leave any items on the stairs.  Make  sure that there are handrails on both sides of the stairs and use them. Fix handrails that are broken or loose. Make sure that handrails are as long as the stairways.  Check any carpeting to make sure that it is firmly attached to the stairs. Fix any carpet that is loose or worn.  Avoid having throw rugs at the top or bottom of the stairs. If you do have throw rugs, attach them to the floor with carpet tape.  Make sure that you have a light switch at the top of the stairs and the bottom of the stairs. If you do not have them, ask someone to add them for you. What else can I do to help prevent falls?  Wear shoes that:  Do not have high heels.  Have rubber bottoms.  Are comfortable and fit you well.  Are closed at the toe. Do not wear sandals.  If you use a stepladder:  Make sure that it is fully opened. Do not climb a closed stepladder.  Make sure that both sides of the stepladder are locked into place.  Ask  someone to hold it for you, if possible.  Clearly mark and make sure that you can see:  Any grab bars or handrails.  First and last steps.  Where the edge of each step is.  Use tools that help you move around (mobility aids) if they are needed. These include:  Canes.  Walkers.  Scooters.  Crutches.  Turn on the lights when you go into a dark area. Replace any light bulbs as soon as they burn out.  Set up your furniture so you have a clear path. Avoid moving your furniture around.  If any of your floors are uneven, fix them.  If there are any pets around you, be aware of where they are.  Review your medicines with your doctor. Some medicines can make you feel dizzy. This can increase your chance of falling. Ask your doctor what other things that you can do to help prevent falls. This information is not intended to replace advice given to you by your health care provider. Make sure you discuss any questions you have with your health care provider. Document Released: 07/14/2009 Document Revised: 02/23/2016 Document Reviewed: 10/22/2014 Elsevier Interactive Patient Education  2017 [employer].

## 2020-03-10 ENCOUNTER — Other Ambulatory Visit: Payer: Self-pay | Admitting: Internal Medicine

## 2020-03-10 LAB — LIPID PANEL
Chol/HDL Ratio: 2.6 ratio (ref 0.0–4.4)
Cholesterol, Total: 194 mg/dL (ref 100–199)
HDL: 76 mg/dL (ref >39)
LDL Chol Calc (NIH): 108 mg/dL — ABNORMAL HIGH (ref 0–99)
Triglycerides: 51 mg/dL (ref 0–149)
VLDL Cholesterol Cal: 10 mg/dL (ref 5–40)

## 2020-03-10 LAB — CMP14+EGFR
ALT: 18 IU/L (ref 0–32)
AST: 25 IU/L (ref 0–40)
Albumin/Globulin Ratio: 1.7 (ref 1.2–2.2)
Albumin: 4.3 g/dL (ref 3.7–4.7)
Alkaline Phosphatase: 160 IU/L — ABNORMAL HIGH (ref 48–121)
BUN/Creatinine Ratio: 19 (ref 12–28)
BUN: 19 mg/dL (ref 8–27)
Bilirubin Total: 0.6 mg/dL (ref 0.0–1.2)
CO2: 26 mmol/L (ref 20–29)
Calcium: 10.3 mg/dL (ref 8.7–10.3)
Chloride: 96 mmol/L (ref 96–106)
Creatinine, Ser: 0.99 mg/dL (ref 0.57–1.00)
GFR calc Af Amer: 62 mL/min/1.73
GFR calc non Af Amer: 54 mL/min/1.73 — ABNORMAL LOW
Globulin, Total: 2.6 g/dL (ref 1.5–4.5)
Glucose: 73 mg/dL (ref 65–99)
Potassium: 3.8 mmol/L (ref 3.5–5.2)
Sodium: 140 mmol/L (ref 134–144)
Total Protein: 6.9 g/dL (ref 6.0–8.5)

## 2020-03-10 LAB — CBC
Hematocrit: 40.6 % (ref 34.0–46.6)
Hemoglobin: 13.2 g/dL (ref 11.1–15.9)
MCH: 29.7 pg (ref 26.6–33.0)
MCHC: 32.5 g/dL (ref 31.5–35.7)
MCV: 91 fL (ref 79–97)
Platelets: 335 10*3/uL (ref 150–450)
RBC: 4.44 x10E6/uL (ref 3.77–5.28)
RDW: 12.6 % (ref 11.7–15.4)
WBC: 5.8 10*3/uL (ref 3.4–10.8)

## 2020-03-10 LAB — HEMOGLOBIN A1C
Est. average glucose Bld gHb Est-mCnc: 120 mg/dL
Hgb A1c MFr Bld: 5.8 % — ABNORMAL HIGH (ref 4.8–5.6)

## 2020-03-10 LAB — TSH: TSH: 4.7 u[IU]/mL — ABNORMAL HIGH (ref 0.450–4.500)

## 2020-03-10 LAB — T4, FREE: Free T4: 1.59 ng/dL (ref 0.82–1.77)

## 2020-03-10 LAB — VITAMIN B12: Vitamin B-12: 462 pg/mL (ref 232–1245)

## 2020-03-12 NOTE — Progress Notes (Signed)
This visit occurred during the SARS-CoV-2 public health emergency.  Safety protocols were in place, including screening questions prior to the visit, additional usage of staff PPE, and extensive cleaning of exam room while observing appropriate contact time as indicated for disinfecting solutions.  Subjective:     Patient ID: Hannah Castillo , female    DOB: 07/12/1941 , 79 y.o.   MRN: [id number]   Chief Complaint  Patient presents with  . Hypertension  . Hypothyroidism    HPI  She was scheduled for a physical exam today. She does not wish to do b/c she was not aware that her insurance may not cover this.   Hypertension This is a chronic problem. The current episode started more than 1 year ago. The problem has been gradually improving since onset. The problem is controlled. Pertinent negatives include no blurred vision, chest pain, palpitations or shortness of breath. Risk factors for coronary artery disease include dyslipidemia, obesity, post-menopausal state and sedentary lifestyle. Past treatments include ACE inhibitors and diuretics. The current treatment provides moderate improvement. Compliance problems include exercise.  Hypertensive end-organ damage includes kidney disease. Identifiable causes of hypertension include a thyroid problem.  Thyroid Problem Presents for follow-up visit. Patient reports no cold intolerance, constipation, depressed mood or palpitations. The symptoms have been stable.     Past Medical History:  Diagnosis Date  . Degenerative joint disease   . Diabetes mellitus without complication ([place])    "prediabetic"  . History of bronchitis    "a long time ago"  . Hyperlipidemia    takes Zetia and Atorvastatin daily  . Hypertension    takes Lisinopril-HCTZ daily  . Hypothyroidism    takes Synthroid daily  . Joint pain   . PONV (postoperative nausea and vomiting)   . Rheumatoid arthritis(714.0)    takes Plaquenil daily     Family History  Problem  Relation Age of Onset  . Asthma Mother   . Heart attack Father   . Anesthesia problems Neg Hx      Current Outpatient Medications:  .  Ascorbic Acid (VITAMIN C PO), Take 1 tablet by mouth daily., Disp: , Rfl:  .  aspirin 81 MG tablet, Take 81 mg by mouth daily. Po qd (start 01-24-15), Disp: , Rfl:  .  atorvastatin (LIPITOR) 40 MG tablet, TAKE 1 TABLET DAILY, Disp: 90 tablet, Rfl: 3 .  Cholecalciferol (VITAMIN D) 125 MCG (5000 UT) CAPS, Take 1,000 Units by mouth daily. , Disp: , Rfl:  .  ezetimibe (ZETIA) 10 MG tablet, TAKE 1 TABLET EVERY DAY, Disp: 90 tablet, Rfl: 1 .  lisinopril-hydrochlorothiazide (ZESTORETIC) 10-12.5 MG tablet, TAKE 1 TABLET DAILY, Disp: 90 tablet, Rfl: 3 .  omega-3 acid ethyl esters (LOVAZA) 1 g capsule, Take by mouth 2 (two) times daily., Disp: , Rfl:  .  SYNTHROID 150 MCG tablet, Take 1 tablet by mouth Monday - Saturday and 1/2 tablet on sundays, Disp: 90 tablet, Rfl: 3   Allergies  Allergen Reactions  . Hydrocodone Nausea Only    MAKES PT FEEL VERY "SICK"     Review of Systems  Constitutional: Negative.   HENT: Negative.   Eyes: Negative.  Negative for blurred vision.  Respiratory: Negative.  Negative for shortness of breath.   Cardiovascular: Negative.  Negative for chest pain and palpitations.  Gastrointestinal: Negative for constipation.  Endocrine: Negative.  Negative for cold intolerance.  Genitourinary: Negative.   Musculoskeletal: Negative.   Skin: Negative.   Allergic/Immunologic: Negative.   Neurological: Negative.  Hematological: Negative.   Psychiatric/Behavioral: Negative.      Today's Vitals   03/09/20 1029  BP: 124/82  Pulse: 81  Temp: 98 F (36.7 C)  TempSrc: Oral  SpO2: 96%  Weight: 202 lb (91.6 kg)  Height: 5' 2.4" (1.585 m)   Body mass index is 36.47 kg/m.   Objective:  Physical Exam Vitals and nursing note reviewed.  Constitutional:      Appearance: Normal appearance.  HENT:     Head: Normocephalic and atraumatic.      Right Ear: Tympanic membrane, ear canal and external ear normal.     Left Ear: Tympanic membrane, ear canal and external ear normal.     Nose:     Comments: Deferred, masked    Mouth/Throat:     Comments: Deferred, masked Eyes:     Extraocular Movements: Extraocular movements intact.     Conjunctiva/sclera: Conjunctivae normal.     Pupils: Pupils are equal, round, and reactive to light.  Cardiovascular:     Rate and Rhythm: Normal rate and regular rhythm.     Pulses: Normal pulses.     Heart sounds: Normal heart sounds.  Pulmonary:     Effort: Pulmonary effort is normal.     Breath sounds: Normal breath sounds.  Chest:     Breasts: Tanner Score is 5.        Right: Normal.        Left: Normal.  Abdominal:     General: Bowel sounds are normal.     Palpations: Abdomen is soft.     Comments: Rounded, soft  Genitourinary:    Comments: deferred Musculoskeletal:        General: Normal range of motion.     Cervical back: Normal range of motion and neck supple.  Skin:    General: Skin is warm and dry.  Neurological:     General: No focal deficit present.     Mental Status: She is alert and oriented to person, place, and time.  Psychiatric:        Mood and Affect: Mood normal.        Behavior: Behavior normal.         Assessment And Plan:     1. Hypertensive nephropathy  Chronic, well controlled. She will continue with current meds. She is encouraged to avoid adding salt to her foods. EKG performed, no new changes noted. She is encouraged to exercise 30 minutes five days per week. She will rto in six months for re-evaluation.   - EKG 12-Lead - CMP14+EGFR - CBC - Lipid panel  2. Chronic renal disease, stage II  Chronic, this has been stable. I will check renal function today. She is encouraged to stay well hydrated and to keep BP optimally controlled to avoid progression of CKD.   3. Primary hypothyroidism  I will check thyroid panel and adjust meds as  needed.  - TSH - T4, Free  4. Other abnormal glucose  HER A1C HAS BEEN ELEVATED IN THE PAST. I WILL CHECK AN A1C, BMET TODAY. SHE WAS ENCOURAGED TO AVOID SUGARY BEVERAGES AND PROCESSED FOODS INCLUDNG BREADS, RICE AND PASTA.  - Hemoglobin A1c  5. Vitamin D deficiency disease  I WILL CHECK A VIT D LEVEL AND SUPPLEMENT AS NEEDED.  ALSO ENCOURAGED TO SPEND 15 MINUTES IN THE SUN DAILY.   6. Drug therapy  - Vitamin B12  7. Class 2 severe obesity due to excess calories with serious comorbidity and body mass index (BMI) of  36.0 to 36.9 in adult [facility])  She is encouraged to strive for BMI less than 30 to decrease cardiac risk. She is encouraged to avoid sugary beverages and to exercise at least 150 minutes per week.   Maximino Greenland, MD    THE PATIENT IS ENCOURAGED TO PRACTICE SOCIAL DISTANCING DUE TO THE COVID-19 PANDEMIC.

## 2020-07-03 DIAGNOSIS — Z23 Encounter for immunization: Secondary | ICD-10-CM | POA: Diagnosis not present

## 2020-09-08 ENCOUNTER — Ambulatory Visit (INDEPENDENT_AMBULATORY_CARE_PROVIDER_SITE_OTHER): Payer: Medicare Other | Admitting: Internal Medicine

## 2020-09-08 ENCOUNTER — Other Ambulatory Visit: Payer: Self-pay

## 2020-09-08 ENCOUNTER — Encounter: Payer: Self-pay | Admitting: Internal Medicine

## 2020-09-08 VITALS — BP 130/78 | HR 82 | Temp 98.1°F | Ht 62.4 in | Wt 203.0 lb

## 2020-09-08 DIAGNOSIS — E039 Hypothyroidism, unspecified: Secondary | ICD-10-CM | POA: Diagnosis not present

## 2020-09-08 DIAGNOSIS — Z79899 Other long term (current) drug therapy: Secondary | ICD-10-CM

## 2020-09-08 DIAGNOSIS — I129 Hypertensive chronic kidney disease with stage 1 through stage 4 chronic kidney disease, or unspecified chronic kidney disease: Secondary | ICD-10-CM

## 2020-09-08 DIAGNOSIS — N182 Chronic kidney disease, stage 2 (mild): Secondary | ICD-10-CM

## 2020-09-08 DIAGNOSIS — Z6836 Body mass index (BMI) 36.0-36.9, adult: Secondary | ICD-10-CM

## 2020-09-08 DIAGNOSIS — E559 Vitamin D deficiency, unspecified: Secondary | ICD-10-CM

## 2020-09-08 DIAGNOSIS — E2839 Other primary ovarian failure: Secondary | ICD-10-CM | POA: Diagnosis not present

## 2020-09-08 DIAGNOSIS — R7309 Other abnormal glucose: Secondary | ICD-10-CM

## 2020-09-08 NOTE — Patient Instructions (Signed)

## 2020-09-08 NOTE — Progress Notes (Signed)
Rutherford Nail as a scribe for Maximino Greenland, MD.,have documented all relevant documentation on the behalf of Maximino Greenland, MD,as directed by  Maximino Greenland, MD while in the presence of Maximino Greenland, MD. This visit occurred during the SARS-CoV-2 public health emergency.  Safety protocols were in place, including screening questions prior to the visit, additional usage of staff PPE, and extensive cleaning of exam room while observing appropriate contact time as indicated for disinfecting solutions.  Subjective:     Patient ID: Hannah Castillo , female    DOB: Jan 14, 1941 , 79 y.o.   MRN: [id number]   Chief Complaint  Patient presents with  . Hypertension    HPI  Pt here today for hypertension f/u. She reports compliance with meds. Denies headaches, chest pain and shortness of breath.   Hypertension This is a chronic problem. The current episode started more than 1 year ago. The problem has been gradually improving since onset. The problem is controlled. Pertinent negatives include no blurred vision, chest pain, headaches, palpitations or shortness of breath. Risk factors for coronary artery disease include dyslipidemia, obesity, post-menopausal state and sedentary lifestyle. Past treatments include ACE inhibitors and diuretics. The current treatment provides moderate improvement. Compliance problems include exercise.  Hypertensive end-organ damage includes kidney disease. Identifiable causes of hypertension include a thyroid problem.  Thyroid Problem Presents for follow-up visit. Patient reports no cold intolerance, constipation, depressed mood, fatigue or palpitations. The symptoms have been stable.     Past Medical History:  Diagnosis Date  . Degenerative joint disease   . Diabetes mellitus without complication ([place])    "prediabetic"  . History of bronchitis    "a long time ago"  . Hyperlipidemia    takes Zetia and Atorvastatin daily  . Hypertension    takes  Lisinopril-HCTZ daily  . Hypothyroidism    takes Synthroid daily  . Joint pain   . PONV (postoperative nausea and vomiting)   . Rheumatoid arthritis(714.0)    takes Plaquenil daily     Family History  Problem Relation Age of Onset  . Asthma Mother   . Heart attack Father   . Anesthesia problems Neg Hx     No current facility-administered medications for this visit.  Current Outpatient Medications:  .  lisinopril (ZESTRIL) 10 MG tablet, Take 1 tablet (10 mg total) by mouth daily., Disp: 30 tablet, Rfl: 11  Facility-Administered Medications Ordered in Other Visits:  .  acetaminophen (TYLENOL) tablet 650 mg, 650 mg, Oral, Q6H PRN **OR** acetaminophen (TYLENOL) suppository 650 mg, 650 mg, Rectal, Q6H PRN, Karmen Bongo, MD .  aspirin chewable tablet 81 mg, 81 mg, Oral, Daily, Karmen Bongo, MD, 81 mg at 09/26/20 0856 .  atorvastatin (LIPITOR) tablet 40 mg, 40 mg, Oral, QHS, Karmen Bongo, MD, 40 mg at 09/25/20 2203 .  bisacodyl (DULCOLAX) EC tablet 10 mg, 10 mg, Oral, Daily PRN, Debbe Odea, MD, 10 mg at 09/25/20 1153 .  enoxaparin (LOVENOX) injection 40 mg, 40 mg, Subcutaneous, Q24H, Karmen Bongo, MD, 40 mg at 09/25/20 1624 .  ezetimibe (ZETIA) tablet 10 mg, 10 mg, Oral, QHS, Karmen Bongo, MD, 10 mg at 09/25/20 2202 .  insulin aspart (novoLOG) injection 0-15 Units, 0-15 Units, Subcutaneous, TID WC, Karmen Bongo, MD, 3 Units at 09/22/20 1212 .  levothyroxine (SYNTHROID) tablet 150 mcg, 150 mcg, Oral, Once per day on Mon Tue Wed Thu Fri Sat, Yates, Jennifer, MD, 150 mcg at 09/26/20 8756 .  levothyroxine (SYNTHROID) tablet 75 mcg, 75  mcg, Oral, Once per day on Sun, Yates, Jennifer, MD, 75 mcg at 09/25/20 0556 .  morphine 2 MG/ML injection 2 mg, 2 mg, Intravenous, Q2H PRN, Karmen Bongo, MD .  ondansetron [facility]) tablet 4 mg, 4 mg, Oral, Q6H PRN **OR** ondansetron (ZOFRAN) injection 4 mg, 4 mg, Intravenous, Q6H PRN, Karmen Bongo, MD, 4 mg at 09/22/20 1812 .   polyethylene glycol (MIRALAX / GLYCOLAX) packet 17 g, 17 g, Oral, Daily PRN, Debbe Odea, MD, 17 g at 09/25/20 1153 .  sodium chloride flush (NS) 0.9 % injection 3 mL, 3 mL, Intravenous, Q12H, Karmen Bongo, MD, 3 mL at 09/25/20 2203   Allergies  Allergen Reactions  . Hydrocodone Nausea Only    MAKES PT FEEL VERY "SICK"     Review of Systems  Constitutional: Negative.  Negative for fatigue.  HENT: Negative.   Eyes: Negative for blurred vision.  Respiratory: Negative for shortness of breath.   Cardiovascular: Negative for chest pain and palpitations.  Gastrointestinal: Negative for constipation.  Endocrine: Negative for cold intolerance, polydipsia, polyphagia and polyuria.  Musculoskeletal: Negative.   Skin: Negative.   Neurological: Negative for dizziness and headaches.  Psychiatric/Behavioral: Negative.      Today's Vitals   09/08/20 1031  BP: 130/78  Pulse: 82  Temp: 98.1 F (36.7 C)  TempSrc: Oral  Weight: 203 lb (92.1 kg)  Height: 5' 2.4" (1.585 m)  PainSc: 0-No pain   Body mass index is 36.65 kg/m.  Wt Readings from Last 3 Encounters:  09/25/20 198 lb 14.4 oz (90.2 kg)  09/08/20 203 lb (92.1 kg)  03/09/20 202 lb (91.6 kg)   Objective:  Physical Exam Vitals and nursing note reviewed.  Constitutional:      Appearance: Normal appearance. She is obese.  HENT:     Head: Normocephalic and atraumatic.  Cardiovascular:     Rate and Rhythm: Normal rate and regular rhythm.     Heart sounds: Normal heart sounds.  Pulmonary:     Effort: Pulmonary effort is normal.     Breath sounds: Normal breath sounds.  Skin:    General: Skin is warm.  Neurological:     General: No focal deficit present.     Mental Status: She is alert.  Psychiatric:        Mood and Affect: Mood normal.        Behavior: Behavior normal.         Assessment And Plan:     1. Hypertensive nephropathy Comments: Chronic, controlled. She will continue with current meds. Encouraged to  avoid adding salt to her foods. - CMP14+EGFR  2. Chronic renal disease, stage II Comments: Chronic, I will check GFr, Cr today. Encouraged to stay well hydrated and to keep BP controlled to prevent progression of CKD.   3. Primary hypothyroidism Comments: I will check thyroid panel and adjust meds as needed.  - TSH - T4, free  4. Other abnormal glucose Comments: Her a1c has been elevated in the past, I will recheck this, along with BMP today. Encouraged to avoid sugary beverages, including diet drinks.  - Hemoglobin A1c  5. Estrogen deficiency Comments: She declines getting bone density because she doesn't want to do any treatments.   6. Vitamin D deficiency Comments: I will check vitamin D level and supplement as needed.  - Vitamin D (25 hydroxy)  7. Class 2 severe obesity due to excess calories with serious comorbidity and body mass index (BMI) of 36.0 to 36.9 in adult [facility]) Comments: She  is encouraged to strive for BMI less than 30 to decrease cardiac risk. Advised to aim for at least 150 minutes of exercise per week.   8. Drug therapy - Vitamin B12   Patient was given opportunity to ask questions. Patient verbalized understanding of the plan and was able to repeat key elements of the plan. All questions were answered to their satisfaction.  Maximino Greenland, MD   I, Maximino Greenland, MD, have reviewed all documentation for this visit. The documentation on 09/26/20 for the exam, diagnosis, procedures, and orders are all accurate and complete.  THE PATIENT IS ENCOURAGED TO PRACTICE SOCIAL DISTANCING DUE TO THE COVID-19 PANDEMIC.

## 2020-09-09 LAB — CMP14+EGFR
ALT: 15 IU/L (ref 0–32)
AST: 21 IU/L (ref 0–40)
Albumin/Globulin Ratio: 1.7 (ref 1.2–2.2)
Albumin: 4.3 g/dL (ref 3.7–4.7)
Alkaline Phosphatase: 153 IU/L — ABNORMAL HIGH (ref 44–121)
BUN/Creatinine Ratio: 14 (ref 12–28)
BUN: 15 mg/dL (ref 8–27)
Bilirubin Total: 0.9 mg/dL (ref 0.0–1.2)
CO2: 24 mmol/L (ref 20–29)
Calcium: 10.6 mg/dL — ABNORMAL HIGH (ref 8.7–10.3)
Chloride: 99 mmol/L (ref 96–106)
Creatinine, Ser: 1.08 mg/dL — ABNORMAL HIGH (ref 0.57–1.00)
GFR calc Af Amer: 56 mL/min/{1.73_m2} — ABNORMAL LOW (ref >59)
GFR calc non Af Amer: 49 mL/min/{1.73_m2} — ABNORMAL LOW (ref >59)
Globulin, Total: 2.5 g/dL (ref 1.5–4.5)
Glucose: 92 mg/dL (ref 65–99)
Potassium: 4.7 mmol/L (ref 3.5–5.2)
Sodium: 135 mmol/L (ref 134–144)
Total Protein: 6.8 g/dL (ref 6.0–8.5)

## 2020-09-09 LAB — HEMOGLOBIN A1C
Est. average glucose Bld gHb Est-mCnc: 126 mg/dL
Hgb A1c MFr Bld: 6 % — ABNORMAL HIGH (ref 4.8–5.6)

## 2020-09-09 LAB — VITAMIN D 25 HYDROXY (VIT D DEFICIENCY, FRACTURES): Vit D, 25-Hydroxy: 45.3 ng/mL (ref 30.0–100.0)

## 2020-09-09 LAB — VITAMIN B12: Vitamin B-12: 457 pg/mL (ref 232–1245)

## 2020-09-09 LAB — T4, FREE: Free T4: 1.93 ng/dL — ABNORMAL HIGH (ref 0.82–1.77)

## 2020-09-09 LAB — TSH: TSH: 0.575 u[IU]/mL (ref 0.450–4.500)

## 2020-09-19 ENCOUNTER — Telehealth: Payer: Self-pay

## 2020-09-19 NOTE — Telephone Encounter (Signed)
You can place referral to [facility], dx; N18.2. Let her know her kidney function is actually stable - needs to stay well hydrated.  Let her know it may take 2-3 months to get into the specialist.

## 2020-09-19 NOTE — Telephone Encounter (Signed)
The pt called and wanted to know if she could get a referral to a kidney doctor because her kidney function keeps decreasing it seems like every time she gets her lab results.

## 2020-09-20 ENCOUNTER — Other Ambulatory Visit: Payer: Self-pay

## 2020-09-20 DIAGNOSIS — N182 Chronic kidney disease, stage 2 (mild): Secondary | ICD-10-CM

## 2020-09-20 NOTE — Telephone Encounter (Signed)
The pt said to let Dr. Baird [facility] know she said ok, thank you, and for her to have a Merry Christmas.

## 2020-09-21 ENCOUNTER — Other Ambulatory Visit: Payer: Self-pay

## 2020-09-21 ENCOUNTER — Encounter (HOSPITAL_COMMUNITY): Payer: Self-pay | Admitting: Emergency Medicine

## 2020-09-21 ENCOUNTER — Inpatient Hospital Stay (HOSPITAL_COMMUNITY)
Admission: EM | Admit: 2020-09-21 | Discharge: 2020-09-26 | DRG: 641 | Disposition: A | Discharge status: Home / Self Care | Payer: Medicare Other | Attending: Internal Medicine | Admitting: Internal Medicine

## 2020-09-21 ENCOUNTER — Ambulatory Visit (INDEPENDENT_AMBULATORY_CARE_PROVIDER_SITE_OTHER): Payer: Medicare Other

## 2020-09-21 ENCOUNTER — Ambulatory Visit (INDEPENDENT_AMBULATORY_CARE_PROVIDER_SITE_OTHER)
Admission: EM | Admit: 2020-09-21 | Discharge: 2020-09-21 | Disposition: A | Discharge status: Home / Self Care | Payer: Medicare Other | Source: Home / Self Care

## 2020-09-21 DIAGNOSIS — E1122 Type 2 diabetes mellitus with diabetic chronic kidney disease: Secondary | ICD-10-CM | POA: Diagnosis present

## 2020-09-21 DIAGNOSIS — Z79890 Hormone replacement therapy (postmenopausal): Secondary | ICD-10-CM

## 2020-09-21 DIAGNOSIS — R112 Nausea with vomiting, unspecified: Secondary | ICD-10-CM | POA: Insufficient documentation

## 2020-09-21 DIAGNOSIS — Z20822 Contact with and (suspected) exposure to covid-19: Secondary | ICD-10-CM | POA: Insufficient documentation

## 2020-09-21 DIAGNOSIS — R197 Diarrhea, unspecified: Secondary | ICD-10-CM

## 2020-09-21 DIAGNOSIS — Z8249 Family history of ischemic heart disease and other diseases of the circulatory system: Secondary | ICD-10-CM

## 2020-09-21 DIAGNOSIS — E119 Type 2 diabetes mellitus without complications: Secondary | ICD-10-CM

## 2020-09-21 DIAGNOSIS — I959 Hypotension, unspecified: Secondary | ICD-10-CM | POA: Diagnosis not present

## 2020-09-21 DIAGNOSIS — Z713 Dietary counseling and surveillance: Secondary | ICD-10-CM

## 2020-09-21 DIAGNOSIS — E11649 Type 2 diabetes mellitus with hypoglycemia without coma: Secondary | ICD-10-CM | POA: Diagnosis not present

## 2020-09-21 DIAGNOSIS — N182 Chronic kidney disease, stage 2 (mild): Secondary | ICD-10-CM | POA: Diagnosis present

## 2020-09-21 DIAGNOSIS — E66811 Obesity, class 1: Secondary | ICD-10-CM

## 2020-09-21 DIAGNOSIS — E861 Hypovolemia: Secondary | ICD-10-CM | POA: Diagnosis present

## 2020-09-21 DIAGNOSIS — E871 Hypo-osmolality and hyponatremia: Principal | ICD-10-CM | POA: Diagnosis present

## 2020-09-21 DIAGNOSIS — M069 Rheumatoid arthritis, unspecified: Secondary | ICD-10-CM | POA: Diagnosis present

## 2020-09-21 DIAGNOSIS — Z885 Allergy status to narcotic agent status: Secondary | ICD-10-CM

## 2020-09-21 DIAGNOSIS — I10 Essential (primary) hypertension: Secondary | ICD-10-CM | POA: Diagnosis not present

## 2020-09-21 DIAGNOSIS — E6609 Other obesity due to excess calories: Secondary | ICD-10-CM | POA: Diagnosis present

## 2020-09-21 DIAGNOSIS — Z90710 Acquired absence of both cervix and uterus: Secondary | ICD-10-CM

## 2020-09-21 DIAGNOSIS — Z96653 Presence of artificial knee joint, bilateral: Secondary | ICD-10-CM | POA: Diagnosis present

## 2020-09-21 DIAGNOSIS — E860 Dehydration: Secondary | ICD-10-CM | POA: Diagnosis not present

## 2020-09-21 DIAGNOSIS — E785 Hyperlipidemia, unspecified: Secondary | ICD-10-CM | POA: Diagnosis present

## 2020-09-21 DIAGNOSIS — E890 Postprocedural hypothyroidism: Secondary | ICD-10-CM | POA: Diagnosis present

## 2020-09-21 DIAGNOSIS — Z6834 Body mass index (BMI) 34.0-34.9, adult: Secondary | ICD-10-CM

## 2020-09-21 DIAGNOSIS — Z79899 Other long term (current) drug therapy: Secondary | ICD-10-CM

## 2020-09-21 DIAGNOSIS — Z87891 Personal history of nicotine dependence: Secondary | ICD-10-CM

## 2020-09-21 DIAGNOSIS — I129 Hypertensive chronic kidney disease with stage 1 through stage 4 chronic kidney disease, or unspecified chronic kidney disease: Secondary | ICD-10-CM | POA: Diagnosis present

## 2020-09-21 DIAGNOSIS — E039 Hypothyroidism, unspecified: Secondary | ICD-10-CM | POA: Diagnosis present

## 2020-09-21 DIAGNOSIS — Z7982 Long term (current) use of aspirin: Secondary | ICD-10-CM

## 2020-09-21 DIAGNOSIS — T502X5A Adverse effect of carbonic-anhydrase inhibitors, benzothiadiazides and other diuretics, initial encounter: Secondary | ICD-10-CM | POA: Diagnosis present

## 2020-09-21 LAB — POCT URINALYSIS DIPSTICK, ED / UC
Bilirubin Urine: NEGATIVE
Glucose, UA: NEGATIVE mg/dL
Hgb urine dipstick: NEGATIVE
Ketones, ur: NEGATIVE mg/dL
Leukocytes,Ua: NEGATIVE
Nitrite: NEGATIVE
Protein, ur: NEGATIVE mg/dL
Specific Gravity, Urine: 1.015 (ref 1.005–1.030)
Urobilinogen, UA: 0.2 mg/dL (ref 0.0–1.0)
pH: 7 (ref 5.0–8.0)

## 2020-09-21 LAB — CBC WITH DIFFERENTIAL/PLATELET
Abs Immature Granulocytes: 0.02 10*3/uL (ref 0.00–0.07)
Basophils Absolute: 0 10*3/uL (ref 0.0–0.1)
Basophils Relative: 0 %
Eosinophils Absolute: 0 10*3/uL (ref 0.0–0.5)
Eosinophils Relative: 0 %
HCT: 37.2 % (ref 36.0–46.0)
Hemoglobin: 13 g/dL (ref 12.0–15.0)
Immature Granulocytes: 0 %
Lymphocytes Relative: 33 %
Lymphs Abs: 2.4 10*3/uL (ref 0.7–4.0)
MCH: 29.2 pg (ref 26.0–34.0)
MCHC: 34.9 g/dL (ref 30.0–36.0)
MCV: 83.6 fL (ref 80.0–100.0)
Monocytes Absolute: 0.6 10*3/uL (ref 0.1–1.0)
Monocytes Relative: 8 %
Neutro Abs: 4.2 10*3/uL (ref 1.7–7.7)
Neutrophils Relative %: 59 %
Platelets: 349 10*3/uL (ref 150–400)
RBC: 4.45 MIL/uL (ref 3.87–5.11)
RDW: 11.7 % (ref 11.5–15.5)
WBC: 7.2 10*3/uL (ref 4.0–10.5)
nRBC: 0 % (ref 0.0–0.2)

## 2020-09-21 LAB — CBC
HCT: 37.1 % (ref 36.0–46.0)
Hemoglobin: 12.9 g/dL (ref 12.0–15.0)
MCH: 29.5 pg (ref 26.0–34.0)
MCHC: 34.8 g/dL (ref 30.0–36.0)
MCV: 84.7 fL (ref 80.0–100.0)
Platelets: 359 10*3/uL (ref 150–400)
RBC: 4.38 MIL/uL (ref 3.87–5.11)
RDW: 11.7 % (ref 11.5–15.5)
WBC: 6.8 10*3/uL (ref 4.0–10.5)
nRBC: 0 % (ref 0.0–0.2)

## 2020-09-21 LAB — COMPREHENSIVE METABOLIC PANEL
ALT: 20 U/L (ref 0–44)
AST: 34 U/L (ref 15–41)
Albumin: 3.7 g/dL (ref 3.5–5.0)
Alkaline Phosphatase: 104 U/L (ref 38–126)
Anion gap: 11 (ref 5–15)
BUN: 8 mg/dL (ref 8–23)
CO2: 23 mmol/L (ref 22–32)
Calcium: 10 mg/dL (ref 8.9–10.3)
Chloride: 82 mmol/L — ABNORMAL LOW (ref 98–111)
Creatinine, Ser: 0.95 mg/dL (ref 0.44–1.00)
GFR, Estimated: >60 mL/min (ref >60)
Glucose, Bld: 114 mg/dL — ABNORMAL HIGH (ref 70–99)
Potassium: 3.7 mmol/L (ref 3.5–5.1)
Sodium: 116 mmol/L — CL (ref 135–145)
Total Bilirubin: 1.2 mg/dL (ref 0.3–1.2)
Total Protein: 6.9 g/dL (ref 6.5–8.1)

## 2020-09-21 LAB — LIPASE, BLOOD: Lipase: 27 U/L (ref 11–51)

## 2020-09-21 LAB — BASIC METABOLIC PANEL
Anion gap: 10 (ref 5–15)
BUN: 7 mg/dL — ABNORMAL LOW (ref 8–23)
CO2: 24 mmol/L (ref 22–32)
Calcium: 10 mg/dL (ref 8.9–10.3)
Chloride: 84 mmol/L — ABNORMAL LOW (ref 98–111)
Creatinine, Ser: 0.94 mg/dL (ref 0.44–1.00)
GFR, Estimated: >60 mL/min (ref >60)
Glucose, Bld: 101 mg/dL — ABNORMAL HIGH (ref 70–99)
Potassium: 3.6 mmol/L (ref 3.5–5.1)
Sodium: 118 mmol/L — CL (ref 135–145)

## 2020-09-21 LAB — SARS CORONAVIRUS 2 (TAT 6-24 HRS): SARS Coronavirus 2: NEGATIVE

## 2020-09-21 MED ORDER — ONDANSETRON 4 MG PO TBDP: 4.0000 mg | ORAL_TABLET | Freq: Three times a day (TID) | ORAL | 0 refills | Status: DC | PRN

## 2020-09-21 MED ORDER — ONDANSETRON 4 MG PO TBDP
ORAL_TABLET | ORAL | Status: AC
  Filled 2020-09-21: qty 1

## 2020-09-21 MED ORDER — ONDANSETRON 4 MG PO TBDP
4.0000 mg | ORAL_TABLET | Freq: Once | ORAL | Status: AC
  Administered 2020-09-21: 4 mg via ORAL

## 2020-09-21 NOTE — ED Triage Notes (Signed)
Pt sent by UC for low sodium levels. Reports last night she was sick on her stomach and vomiting, then diarrhea starting today.

## 2020-09-21 NOTE — Discharge Instructions (Addendum)
Small frequent sips of fluids- Pedialyte, Gatorade, water, broth- to maintain hydration.  Zofran every 8 hours as needed for nausea or vomiting.   Your xray looks well today, there is no obvious obstruction or constipation currently.  We are testing you for covid as well as evaluating some basic lab tests.  I will call you if there are any positive or concerning findings with these.  Please go to the ER for any worsening of symptoms.

## 2020-09-21 NOTE — ED Triage Notes (Signed)
On Monday started feeling bad, nausea.  Patient thought related to constipation.  This morning still felt sick, has had diarrhea and has felt bad all day.  No vomiting today.  "I just feel sick"

## 2020-09-21 NOTE — ED Provider Notes (Signed)
[facility]    CSN: [id number] Arrival date & time: 09/21/20  1523      History   Chief Complaint Chief Complaint  Patient presents with   Abdominal Pain    HPI CARLINE DURA is a 79 y.o. female.   CORLENE SABIA presents with complaints of nausea, vomiting and diarrhea. Started two days ago feeling nausea and constipated. Took a laxative and then had vomiting and diarrhea all last night. No further emesis today. No blood or black in stool. No abdominal pain. No fevers. No URI symptoms. No known ill contacts. Ate broth and grits today and has tolerated. Has been drinking water but feels like water increases her nausea. Has been fully vaccinated for covid-19. Denies any previous similar.    ROS per HPI, negative if not otherwise mentioned.      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   Primary hypothyroidism 09/10/2018   Other abnormal glucose 09/10/2018   Class 1 obesity due to excess calories with serious comorbidity and body mass index (BMI) of 33.0 to 33.9 in adult 09/10/2018   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    Past Surgical History:  Procedure Laterality Date   ABDOMINAL HYSTERECTOMY  1984   APPENDECTOMY  60's   CATARACT EXTRACTION Bilateral    COLONOSCOPY     THYROIDECTOMY, PARTIAL  at age 56   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      Home Medications    Prior to Admission medications   Medication Sig Start Date End Date Taking? Authorizing Provider  aspirin 81 MG tablet Take 81 mg by mouth daily. Po qd (start 01-24-15)   Yes [provider]  lisinopril-hydrochlorothiazide (ZESTORETIC) 10-12.5 MG tablet TAKE 1 TABLET DAILY 03/10/20  Yes Glendale Chard, MD  SYNTHROID 150 MCG tablet Take 1 tablet by mouth Monday - Saturday and 1/2 tablet on sundays 12/21/19  Yes Glendale Chard, MD  Ascorbic Acid (VITAMIN C PO) Take 1 tablet by mouth daily.    [provider]  atorvastatin (LIPITOR) 40 MG tablet TAKE 1 TABLET DAILY 04/22/19   Glendale Chard, MD  Cholecalciferol (VITAMIN D) 125 MCG (5000 UT) CAPS Take 1,000 Units by mouth daily.     [provider]  ezetimibe (ZETIA) 10 MG tablet TAKE 1 TABLET EVERY DAY 09/28/19   Glendale Chard, MD  omega-3 acid ethyl esters (LOVAZA) 1 g capsule Take by mouth 2 (two) times  daily.    [provider]  ondansetron (ZOFRAN-ODT) 4 MG disintegrating tablet Take 1 tablet (4 mg total) by mouth every 8 (eight) hours as needed for nausea or vomiting. 09/21/20   Zigmund Gottron, NP    Family History Family History  Problem Relation Age of Onset   Asthma Mother    Heart attack Father    Anesthesia problems Neg Hx     Social History Social History   Tobacco Use   Smoking status: Former Smoker    Packs/day: 0.50    Years: 40.00    Pack years: 20.00   Smokeless tobacco: Never Used   Tobacco comment: quit smoiking 61yrs ago  Vaping Use   Vaping Use: Never  used  Substance Use Topics   Alcohol use: No   Drug use: No     Allergies   Hydrocodone   Review of Systems Review of Systems   Physical Exam Triage Vital Signs ED Triage Vitals  Enc Vitals Group     BP 09/21/20 1707 (!) 150/79     Pulse Rate 09/21/20 1707 75     Resp 09/21/20 1707 20     Temp 09/21/20 1707 (!) 97.4 F (36.3 C)     Temp Source 09/21/20 1707 Oral     SpO2 09/21/20 1707 98 %     Weight --      Height --      Head Circumference --      Peak Flow --      Pain Score 09/21/20 1704 8     Pain Loc --      Pain Edu? --      Excl. in [place]? --    No data found.  Updated Vital Signs BP (!) 150/79 (BP Location: Right Arm)    Pulse 75    Temp (!) 97.4 F (36.3 C) (Oral)    Resp 20    SpO2 98%   Visual Acuity Right Eye Distance:   Left Eye Distance:   Bilateral Distance:    Right Eye Near:   Left Eye Near:    Bilateral Near:     Physical Exam Constitutional:      General: She is not in acute distress.    Appearance: She is well-developed.  Cardiovascular:     Rate and Rhythm: Normal rate.  Pulmonary:     Effort: Pulmonary effort is normal.  Abdominal:     Tenderness: There is no abdominal tenderness.  Skin:    General: Skin is warm and dry.  Neurological:     General: No focal deficit present.     Mental Status: She is alert and oriented to person, place, and time.     Motor: No weakness.      UC Treatments / Results  Labs (all labs ordered are listed, but only abnormal results are displayed) Labs Reviewed  BASIC METABOLIC PANEL - Abnormal; Notable for the following components:      Result Value   Sodium 118 (*)    Chloride 84 (*)    Glucose, Bld 101 (*)    BUN 7 (*)    All other components within normal limits  SARS CORONAVIRUS 2 (TAT 6-24 HRS)  CBC WITH DIFFERENTIAL/PLATELET  POCT URINALYSIS DIPSTICK, ED / UC    EKG   Radiology DG Abd 2 Views  Result Date: 09/21/2020 CLINICAL DATA:  Nausea, vomiting, diarrhea for 3 days  EXAM: ABDOMEN - 2 VIEW COMPARISON:  None. FINDINGS: Supine and upright frontal  views of the abdomen and pelvis are obtained. The bowel gas pattern is unremarkable without obstruction or ileus. There are no masses or abnormal calcifications. No free gas in the greater peritoneal sac. Lung bases are clear. IMPRESSION: 1. Unremarkable bowel gas pattern. Electronically Signed   By: Randa Ngo M.D.   On: 09/21/2020 18:39    Procedures Procedures (including critical care time)  Medications Ordered in UC Medications  ondansetron (ZOFRAN-ODT) disintegrating tablet 4 mg (4 mg Oral Given 09/21/20 1746)    Initial Impression / Assessment and Plan / UC Course  I have reviewed the triage vital signs and the nursing notes.  Pertinent labs & imaging results that were available during my care of the patient were reviewed by me and considered in my medical decision making (see chart for details).      zofran in clinic and patient endorses feeling much improved. No vomiting today and no further diarrhea. Xray without evidence of obstruction. covid testing collected and pending. Basic labs obtained as well. Will notify patient of any concerning findings, viral gastroenteritis vs constipation followed by laxitive causing discomfort, discussed as sources of symptoms. Return precautions provided. Patient verbalized understanding and agreeable to plan.  Ambulatory out of clinic without difficulty.    1939: received call from lab for critical lab result of na 118. Call to patient's provided home/mobile phone  with no response, message left to return call.    2041: reached patient and directed her to seek further evaluation and treatment in the ER now for NA of 118. Patient agreeable.  Final Clinical Impressions(s) / UC Diagnoses   Final diagnoses:  Nausea vomiting and diarrhea     Discharge Instructions     Small frequent sips of fluids- Pedialyte, Gatorade, water, broth- to maintain hydration.   Zofran every 8 hours as needed for nausea or vomiting.   Your xray looks well today, there is no obvious obstruction or constipation currently.  We are testing you for covid as well as evaluating some basic lab tests.  I will call you if there are any positive or concerning findings with these.  Please go to the ER for any worsening of symptoms.     ED Prescriptions    Medication Sig Dispense Auth. Provider   ondansetron (ZOFRAN-ODT) 4 MG disintegrating tablet Take 1 tablet (4 mg total) by mouth every 8 (eight) hours as needed for nausea or vomiting. 12 tablet Zigmund Gottron, NP     PDMP not reviewed this encounter.   Zigmund Gottron, NP 09/21/20 2042

## 2020-09-22 ENCOUNTER — Encounter (HOSPITAL_COMMUNITY): Payer: Self-pay | Admitting: Internal Medicine

## 2020-09-22 DIAGNOSIS — Z96653 Presence of artificial knee joint, bilateral: Secondary | ICD-10-CM | POA: Diagnosis present

## 2020-09-22 DIAGNOSIS — R112 Nausea with vomiting, unspecified: Secondary | ICD-10-CM | POA: Diagnosis present

## 2020-09-22 DIAGNOSIS — Z20822 Contact with and (suspected) exposure to covid-19: Secondary | ICD-10-CM | POA: Diagnosis present

## 2020-09-22 DIAGNOSIS — I10 Essential (primary) hypertension: Secondary | ICD-10-CM | POA: Diagnosis not present

## 2020-09-22 DIAGNOSIS — E6609 Other obesity due to excess calories: Secondary | ICD-10-CM | POA: Diagnosis present

## 2020-09-22 DIAGNOSIS — E860 Dehydration: Secondary | ICD-10-CM | POA: Diagnosis present

## 2020-09-22 DIAGNOSIS — Z79890 Hormone replacement therapy (postmenopausal): Secondary | ICD-10-CM | POA: Diagnosis not present

## 2020-09-22 DIAGNOSIS — Z90710 Acquired absence of both cervix and uterus: Secondary | ICD-10-CM | POA: Diagnosis not present

## 2020-09-22 DIAGNOSIS — M069 Rheumatoid arthritis, unspecified: Secondary | ICD-10-CM | POA: Diagnosis present

## 2020-09-22 DIAGNOSIS — N182 Chronic kidney disease, stage 2 (mild): Secondary | ICD-10-CM | POA: Diagnosis present

## 2020-09-22 DIAGNOSIS — Z6834 Body mass index (BMI) 34.0-34.9, adult: Secondary | ICD-10-CM | POA: Diagnosis not present

## 2020-09-22 DIAGNOSIS — Z79899 Other long term (current) drug therapy: Secondary | ICD-10-CM | POA: Diagnosis not present

## 2020-09-22 DIAGNOSIS — E11649 Type 2 diabetes mellitus with hypoglycemia without coma: Secondary | ICD-10-CM | POA: Diagnosis not present

## 2020-09-22 DIAGNOSIS — E871 Hypo-osmolality and hyponatremia: Secondary | ICD-10-CM | POA: Diagnosis present

## 2020-09-22 DIAGNOSIS — R197 Diarrhea, unspecified: Secondary | ICD-10-CM | POA: Diagnosis present

## 2020-09-22 DIAGNOSIS — Z885 Allergy status to narcotic agent status: Secondary | ICD-10-CM | POA: Diagnosis not present

## 2020-09-22 DIAGNOSIS — E785 Hyperlipidemia, unspecified: Secondary | ICD-10-CM | POA: Diagnosis present

## 2020-09-22 DIAGNOSIS — I959 Hypotension, unspecified: Secondary | ICD-10-CM | POA: Diagnosis not present

## 2020-09-22 DIAGNOSIS — E119 Type 2 diabetes mellitus without complications: Secondary | ICD-10-CM | POA: Diagnosis not present

## 2020-09-22 DIAGNOSIS — E861 Hypovolemia: Secondary | ICD-10-CM | POA: Diagnosis present

## 2020-09-22 DIAGNOSIS — I129 Hypertensive chronic kidney disease with stage 1 through stage 4 chronic kidney disease, or unspecified chronic kidney disease: Secondary | ICD-10-CM | POA: Diagnosis present

## 2020-09-22 DIAGNOSIS — Z87891 Personal history of nicotine dependence: Secondary | ICD-10-CM | POA: Diagnosis not present

## 2020-09-22 DIAGNOSIS — Z8249 Family history of ischemic heart disease and other diseases of the circulatory system: Secondary | ICD-10-CM | POA: Diagnosis not present

## 2020-09-22 DIAGNOSIS — E890 Postprocedural hypothyroidism: Secondary | ICD-10-CM | POA: Diagnosis present

## 2020-09-22 DIAGNOSIS — Z7982 Long term (current) use of aspirin: Secondary | ICD-10-CM | POA: Diagnosis not present

## 2020-09-22 DIAGNOSIS — E039 Hypothyroidism, unspecified: Secondary | ICD-10-CM | POA: Diagnosis not present

## 2020-09-22 DIAGNOSIS — E1122 Type 2 diabetes mellitus with diabetic chronic kidney disease: Secondary | ICD-10-CM | POA: Diagnosis present

## 2020-09-22 LAB — BASIC METABOLIC PANEL
Anion gap: 10 (ref 5–15)
Anion gap: 11 (ref 5–15)
Anion gap: 8 (ref 5–15)
BUN: 5 mg/dL — ABNORMAL LOW (ref 8–23)
BUN: 5 mg/dL — ABNORMAL LOW (ref 8–23)
BUN: <5 mg/dL — ABNORMAL LOW (ref 8–23)
CO2: 22 mmol/L (ref 22–32)
CO2: 22 mmol/L (ref 22–32)
CO2: 22 mmol/L (ref 22–32)
Calcium: 9.4 mg/dL (ref 8.9–10.3)
Calcium: 9.6 mg/dL (ref 8.9–10.3)
Calcium: 9.2 mg/dL (ref 8.9–10.3)
Chloride: 81 mmol/L — ABNORMAL LOW (ref 98–111)
Chloride: 83 mmol/L — ABNORMAL LOW (ref 98–111)
Chloride: 86 mmol/L — ABNORMAL LOW (ref 98–111)
Creatinine, Ser: 0.88 mg/dL (ref 0.44–1.00)
Creatinine, Ser: 0.82 mg/dL (ref 0.44–1.00)
Creatinine, Ser: 0.88 mg/dL (ref 0.44–1.00)
GFR, Estimated: >60 mL/min (ref >60)
GFR, Estimated: >60 mL/min (ref >60)
GFR, Estimated: >60 mL/min (ref >60)
Glucose, Bld: 156 mg/dL — ABNORMAL HIGH (ref 70–99)
Glucose, Bld: 89 mg/dL (ref 70–99)
Glucose, Bld: 96 mg/dL (ref 70–99)
Potassium: 3.7 mmol/L (ref 3.5–5.1)
Potassium: 3.8 mmol/L (ref 3.5–5.1)
Potassium: 3.8 mmol/L (ref 3.5–5.1)
Sodium: 113 mmol/L — CL (ref 135–145)
Sodium: 116 mmol/L — CL (ref 135–145)
Sodium: 116 mmol/L — CL (ref 135–145)

## 2020-09-22 LAB — CBG MONITORING, ED
Glucose-Capillary: 162 mg/dL — ABNORMAL HIGH (ref 70–99)
Glucose-Capillary: 93 mg/dL (ref 70–99)

## 2020-09-22 LAB — CORTISOL: Cortisol, Plasma: 13 ug/dL

## 2020-09-22 LAB — GLUCOSE, CAPILLARY: Glucose-Capillary: 116 mg/dL — ABNORMAL HIGH (ref 70–99)

## 2020-09-22 LAB — SODIUM, URINE, RANDOM: Sodium, Ur: 42 mmol/L

## 2020-09-22 LAB — OSMOLALITY, URINE: Osmolality, Ur: 226 mOsm/kg — ABNORMAL LOW (ref 300–900)

## 2020-09-22 MED ORDER — SODIUM CHLORIDE 0.9% FLUSH
3.0000 mL | Freq: Two times a day (BID) | INTRAVENOUS | Status: DC
  Administered 2020-09-22 – 2020-09-25 (×6): 3 mL via INTRAVENOUS

## 2020-09-22 MED ORDER — DEXTROSE 5 % IV SOLN: INTRAVENOUS | Status: DC

## 2020-09-22 MED ORDER — ASPIRIN 81 MG PO CHEW
81.0000 mg | CHEWABLE_TABLET | Freq: Every day | ORAL | Status: DC
  Administered 2020-09-22 – 2020-09-26 (×5): 81 mg via ORAL
  Filled 2020-09-22 (×6): qty 1

## 2020-09-22 MED ORDER — ONDANSETRON HCL 4 MG PO TABS: 4.0000 mg | ORAL_TABLET | Freq: Four times a day (QID) | ORAL | Status: DC | PRN

## 2020-09-22 MED ORDER — HYDRALAZINE HCL 20 MG/ML IJ SOLN: 5.0000 mg | INTRAMUSCULAR | Status: DC | PRN

## 2020-09-22 MED ORDER — ENOXAPARIN SODIUM 40 MG/0.4ML ~~LOC~~ SOLN
40.0000 mg | SUBCUTANEOUS | Status: DC
  Administered 2020-09-22 – 2020-09-25 (×4): 40 mg via SUBCUTANEOUS
  Filled 2020-09-22 (×4): qty 0.4

## 2020-09-22 MED ORDER — LEVOTHYROXINE SODIUM 75 MCG PO TABS
150.0000 ug | ORAL_TABLET | Freq: Once | ORAL | Status: AC
  Administered 2020-09-22: 09:00:00 150 ug via ORAL
  Filled 2020-09-22: qty 2

## 2020-09-22 MED ORDER — SODIUM CHLORIDE 0.9 % IV SOLN: INTRAVENOUS | Status: DC

## 2020-09-22 MED ORDER — SODIUM CHLORIDE 0.9 % IV BOLUS (SEPSIS)
500.0000 mL | Freq: Once | INTRAVENOUS | Status: AC
  Administered 2020-09-22: 06:00:00 500 mL via INTRAVENOUS

## 2020-09-22 MED ORDER — ACETAMINOPHEN 650 MG RE SUPP: 650.0000 mg | Freq: Four times a day (QID) | RECTAL | Status: DC | PRN

## 2020-09-22 MED ORDER — MORPHINE SULFATE (PF) 2 MG/ML IV SOLN
2.0000 mg | INTRAVENOUS | Status: DC | PRN
Start: 2020-09-22 — End: 2020-09-26

## 2020-09-22 MED ORDER — ONDANSETRON HCL 4 MG/2ML IJ SOLN
4.0000 mg | Freq: Four times a day (QID) | INTRAMUSCULAR | Status: DC | PRN
  Administered 2020-09-22: 18:00:00 4 mg via INTRAVENOUS
  Filled 2020-09-22: qty 2

## 2020-09-22 MED ORDER — ONDANSETRON HCL 4 MG/2ML IJ SOLN
4.0000 mg | Freq: Once | INTRAMUSCULAR | Status: AC
  Administered 2020-09-22: 06:00:00 4 mg via INTRAVENOUS
  Filled 2020-09-22: qty 2

## 2020-09-22 MED ORDER — LEVOTHYROXINE SODIUM 75 MCG PO TABS
75.0000 ug | ORAL_TABLET | ORAL | Status: DC
  Administered 2020-09-25: 06:00:00 75 ug via ORAL
  Filled 2020-09-22: qty 1

## 2020-09-22 MED ORDER — ATORVASTATIN CALCIUM 40 MG PO TABS
40.0000 mg | ORAL_TABLET | Freq: Every day | ORAL | Status: DC
  Administered 2020-09-22 – 2020-09-25 (×4): 40 mg via ORAL
  Filled 2020-09-22 (×4): qty 1

## 2020-09-22 MED ORDER — EZETIMIBE 10 MG PO TABS
10.0000 mg | ORAL_TABLET | Freq: Every day | ORAL | Status: DC
  Administered 2020-09-22 – 2020-09-25 (×4): 10 mg via ORAL
  Filled 2020-09-22 (×5): qty 1

## 2020-09-22 MED ORDER — SODIUM CHLORIDE 0.9 % IV SOLN: Freq: Once | INTRAVENOUS | Status: DC

## 2020-09-22 MED ORDER — LEVOTHYROXINE SODIUM 75 MCG PO TABS
150.0000 ug | ORAL_TABLET | ORAL | Status: DC
  Administered 2020-09-23 – 2020-09-26 (×3): 150 ug via ORAL
  Filled 2020-09-22 (×3): qty 2

## 2020-09-22 MED ORDER — ACETAMINOPHEN 325 MG PO TABS: 650.0000 mg | ORAL_TABLET | Freq: Four times a day (QID) | ORAL | Status: DC | PRN

## 2020-09-22 MED ORDER — INSULIN ASPART 100 UNIT/ML ~~LOC~~ SOLN
0.0000 [IU] | Freq: Three times a day (TID) | SUBCUTANEOUS | Status: DC
  Administered 2020-09-22: 12:00:00 3 [IU] via SUBCUTANEOUS

## 2020-09-22 NOTE — ED Provider Notes (Signed)
[facility] EMERGENCY DEPARTMENT Provider Note   CSN: [id number] Arrival date & time: 09/21/20  2116     History Chief Complaint  Patient presents with  . Abnormal Lab    Hannah Castillo is a 79 y.o. female.  The history is provided by the patient.  Weakness Severity:  Moderate Onset quality:  Gradual Duration:  3 days Progression:  Worsening Chronicity:  New Relieved by:  Nothing Worsened by:  Nothing Associated symptoms: diarrhea, nausea and vomiting   Associated symptoms: no abdominal pain, no chest pain, no dysuria and no fever   Patient reports approximately 3 days ago she began having generalized weakness and nausea.  She reports she then began to vomit.  She had some constipation and took Ex-Lax.  Since that time she has had multiple episodes of diarrhea.  She denies any chest or abdominal pain.  No fevers or cough.  No known sick contacts.  She was seen at urgent care and was sent for further evaluation     Past Medical History:  Diagnosis Date  . Degenerative joint disease   . Diabetes mellitus without complication ([place])    "prediabetic"  . History of bronchitis    "a long time ago"  . Hyperlipidemia    takes Zetia and Atorvastatin daily  . Hypertension    takes Lisinopril-HCTZ daily  . Hypothyroidism    takes Synthroid daily  . Joint pain   . PONV (postoperative nausea and vomiting)   . Rheumatoid arthritis(714.0)    takes Plaquenil daily    Patient Active Problem List   Diagnosis Date Noted  . Hypertensive nephropathy 09/10/2018  . 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 Primary hypothyroidism 09/10/2018  . Other abnormal glucose 09/10/2018  . Class 1 obesity due to excess calories with serious comorbidity and body mass index (BMI) of 33.0 to 33.9 in adult 09/10/2018  . 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 ABDOMINAL HYSTERECTOMY  1984  .  APPENDECTOMY  60's  . CATARACT EXTRACTION Bilateral   . COLONOSCOPY    . THYROIDECTOMY, PARTIAL  at age 35  . TOTAL KNEE ARTHROPLASTY  10/17/2011   Procedure: TOTAL KNEE ARTHROPLASTY;  Surgeon: Ninetta Lights, MD;  Location: [facility];  Service: Orthopedics;  Laterality: Right;  120 MINUTES FOR THIS SURGERY  . TOTAL KNEE ARTHROPLASTY Left 01/12/2015  . TOTAL KNEE ARTHROPLASTY Left 01/12/2015   Procedure: TOTAL KNEE ARTHROPLASTY;  Surgeon: Kathryne Hitch, MD;  Location: [facility];  Service: Orthopedics;  Laterality: Left;     OB History   No obstetric history on file.     Family History  Problem Relation Age of Onset  . Asthma Mother   . Heart attack Father   . Anesthesia problems Neg Hx     Social History   Tobacco Use  . Smoking status: Former Smoker    Packs/day: 0.50    Years: 40.00    Pack years: 20.00  . Smokeless tobacco: Never Used  . Tobacco comment: quit smoiking 59yrs ago  Vaping Use  . Vaping Use: Never used  Substance Use Topics  . Alcohol use: No  . Drug use: No    Home Medications Prior to Admission medications   Medication Sig Start Date End Date Taking? Authorizing Provider  Ascorbic Acid (VITAMIN C PO) Take 1 tablet by mouth daily.    [provider]  aspirin 81 MG tablet Take  81 mg by mouth daily. Po qd (start 01-24-15)    [provider]  atorvastatin (LIPITOR) 40 MG tablet TAKE 1 TABLET DAILY 04/22/19   Dorothyann Peng, MD  Cholecalciferol (VITAMIN D) 125 MCG (5000 UT) CAPS Take 1,000 Units by mouth daily.     [provider]  ezetimibe (ZETIA) 10 MG tablet TAKE 1 TABLET EVERY DAY 09/28/19   Dorothyann Peng, MD  lisinopril-hydrochlorothiazide (ZESTORETIC) 10-12.5 MG tablet TAKE 1 TABLET DAILY 03/10/20   Dorothyann Peng, MD  omega-3 acid ethyl esters (LOVAZA) 1 g capsule Take by mouth 2 (two) times daily.    [provider]  ondansetron (ZOFRAN-ODT) 4 MG disintegrating tablet Take 1 tablet (4 mg total) by mouth every 8 (eight)  hours as needed for nausea or vomiting. 09/21/20   Georgetta Haber, NP  SYNTHROID 150 MCG tablet Take 1 tablet by mouth Monday - Saturday and 1/2 tablet on sundays 12/21/19   Dorothyann Peng, MD    Allergies    Hydrocodone  Review of Systems   Review of Systems  Constitutional: Positive for fatigue. Negative for fever.  Cardiovascular: Negative for chest pain.  Gastrointestinal: Positive for diarrhea, nausea and vomiting. Negative for abdominal pain.  Genitourinary: Negative for dysuria.  Neurological: Positive for weakness.  All other systems reviewed and are negative.   Physical Exam Updated Vital Signs BP (!) 144/71   Pulse 87   Temp (!) 97.5 F (36.4 C) (Oral)   Resp 18   Ht 1.626 m (5\' 4" )   Wt 90.7 kg   SpO2 100%   BMI 34.33 kg/m   Physical Exam CONSTITUTIONAL: Elderly, no acute distress HEAD: Normocephalic/atraumatic EYES: EOMI/PERRL ENMT: Mucous membranes dry NECK: supple no meningeal signs SPINE/BACK:entire spine nontender CV: S1/S2 noted, no murmurs/rubs/gallops noted LUNGS: Lungs are clear to auscultation bilaterally, no apparent distress ABDOMEN: soft, nontender, no rebound or guarding, bowel sounds noted throughout abdomen GU:no cva tenderness NEURO: Pt is awake/alert/appropriate, moves all extremitiesx4.  No facial droop.  No arm or leg drift EXTREMITIES: pulses normal/equal, full ROM SKIN: warm, color normal PSYCH: no abnormalities of mood noted, alert and oriented to situation  ED Results / Procedures / Treatments   Labs (all labs ordered are listed, but only abnormal results are displayed) Labs Reviewed  COMPREHENSIVE METABOLIC PANEL - Abnormal; Notable for the following components:      Result Value   Sodium 116 (*)    Chloride 82 (*)    Glucose, Bld 114 (*)    All other components within normal limits  LIPASE, BLOOD  CBC    EKG EKG Interpretation  Date/Time:  Thursday September 22 2020 06:02:23 EST Ventricular Rate:  82 PR Interval:     QRS Duration: 86 QT Interval:  367 QTC Calculation: 429 R Axis:   160 Text Interpretation: Right and left arm electrode reversal, interpretation assumes no reversal Sinus rhythm Right axis deviation Low voltage, precordial leads Abnormal T, consider ischemia, lateral leads No previous ECGs available Confirmed by Zadie Rhine ([id number]) on 09/22/2020 6:08:20 AM   Radiology DG Abd 2 Views  Result Date: 09/21/2020 CLINICAL DATA:  Nausea, vomiting, diarrhea for 3 days EXAM: ABDOMEN - 2 VIEW COMPARISON:  None. FINDINGS: Supine and upright frontal views of the abdomen and pelvis are obtained. The bowel gas pattern is unremarkable without obstruction or ileus. There are no masses or abnormal calcifications. No free gas in the greater peritoneal sac. Lung bases are clear. IMPRESSION: 1. Unremarkable bowel gas pattern. Electronically Signed  By: Randa Ngo M.D.   On: 09/21/2020 18:39    Procedures .Critical Care Performed by: Ripley Fraise, MD Authorized by: Ripley Fraise, MD   Critical care provider statement:    Critical care time (minutes):  38   Critical care start time:  09/22/2020 6:00 AM   Critical care end time:  09/22/2020 6:38 AM   Critical care time was exclusive of:  Separately billable procedures and treating other patients   Critical care was necessary to treat or prevent imminent or life-threatening deterioration of the following conditions:  Metabolic crisis and dehydration   Critical care was time spent personally by me on the following activities:  Discussions with consultants, development of treatment plan with patient or surrogate, examination of patient, re-evaluation of patient's condition, review of old charts, ordering and review of laboratory studies and ordering and performing treatments and interventions   I assumed direction of critical care for this patient from another provider in my specialty: no     Care discussed with: admitting provider       Medications Ordered in ED Medications  sodium chloride 0.9 % bolus 500 mL (500 mLs Intravenous New Bag/Given 09/22/20 0622)  0.9 %  sodium chloride infusion (has no administration in time range)  ondansetron (ZOFRAN) injection 4 mg (4 mg Intravenous Given 09/22/20 2774)    ED Course  I have reviewed the triage vital signs and the nursing notes.  Pertinent labs results that were available during my care of the patient were reviewed by me and considered in my medical decision making (see chart for details).    MDM Rules/Calculators/A&P                          6:20 AM Patient with history of diabetes, hypertension presents with generalized weakness.  She reported episodes of vomiting and then diarrhea after taking Ex-Lax.  Patient found to have significant hyponatremia with sodium now at 116.  Suspect underlying dehydration.  Patient does take a diuretic likely worsened this issue.  Patient will require admission for Na replacement 6:29 AM Discussed the case with Dr. Alcario Drought for admission.  Patient has received a 500 cc bolus of normal saline here.  Additional fluid orders has been arranged by Dr. Alcario Drought  Pt without any pain complaints/no focal ABD tenderness Awaiting admission at this time  Final Clinical Impression(s) / ED Diagnoses Final diagnoses:  Hyponatremia  Dehydration    Rx / DC Orders ED Discharge Orders    None       Ripley Fraise, MD 09/22/20 [phone]

## 2020-09-22 NOTE — H&P (Signed)
History and Physical    Hannah Castillo [id number] DOB: Apr 27, 1941 DOA: 09/21/2020  PCP: Glendale Chard, MD Consultants:  Percell Miller - orthopedics Patient coming from:  Home - lives alone; NOK: Daughters, Claiborne Billings and Pabellones, [phone]   Chief Complaint: N/V/D  HPI: Hannah Castillo is a 79 y.o. female with medical history significant of RA; hypothyroidism; HTN; HLD; and DM presenting with n/v/d.  The patient started with n/v/d on Sunday night or Monday.  She does not think she ate anything that caused symptoms.  No recent antibiotics.  She denies sick contacts.  No fever.  Her last emesis was yesterday.  She has had diarrhea at least once today and she had at least 2+ loose stools yesterday.  She denies feeling confused although her daughter reports cognitive slowing.  She has felt very tired.    ED Course:  Carryover, per Dr. Alcario Drought:  79 yo F with N/V/D, sodium of 116. EDP starting to rehydrate. Presumably hypovolemic hyponatremia.  Normal sodium on 12/9.   Review of Systems: As per HPI; otherwise review of systems reviewed and negative.    COVID Vaccine Status:   Complete plus booster  Past Medical History:  Diagnosis Date  . Degenerative joint disease   . Diabetes mellitus without complication ([place])    "prediabetic"  . History of bronchitis    "a long time ago"  . Hyperlipidemia    takes Zetia and Atorvastatin daily  . Hypertension    takes Lisinopril-HCTZ daily  . Hypothyroidism    takes Synthroid daily  . Joint pain   . PONV (postoperative nausea and vomiting)   . Rheumatoid arthritis(714.0)    takes Plaquenil daily    Past Surgical History:  Procedure Laterality Date  . ABDOMINAL HYSTERECTOMY  1984  . APPENDECTOMY  60's  . CATARACT EXTRACTION Bilateral   . COLONOSCOPY    . THYROIDECTOMY, PARTIAL  at age 21  . TOTAL KNEE ARTHROPLASTY  10/17/2011   Procedure: TOTAL KNEE ARTHROPLASTY;  Surgeon: Ninetta Lights, MD;  Location: [place];  Service:  Orthopedics;  Laterality: Right;  120 MINUTES FOR THIS SURGERY  . TOTAL KNEE ARTHROPLASTY Left 01/12/2015  . TOTAL KNEE ARTHROPLASTY Left 01/12/2015   Procedure: TOTAL KNEE ARTHROPLASTY;  Surgeon: Kathryne Hitch, MD;  Location: [place];  Service: Orthopedics;  Laterality: Left;    Social History   Socioeconomic History  . Marital status: Widowed    Spouse name: Not on file  . Number of children: Not on file  . Years of education: Not on file  . Highest education level: Not on file  Occupational History  . Occupation: retired  Tobacco Use  . Smoking status: Former Smoker    Packs/day: 0.50    Years: 40.00    Pack years: 20.00  . Smokeless tobacco: Never Used  . Tobacco comment: quit smoiking 73yrs ago  Vaping Use  . Vaping Use: Never used  Substance and Sexual Activity  . Alcohol use: No  . Drug use: No  . Sexual activity: Not Currently    Birth control/protection: Surgical  Other Topics Concern  . Not on file  Social History Narrative  . Not on file   Social Determinants of Health   Financial Resource Strain: Low Risk   . Difficulty of Paying Living Expenses: Not hard at all  Food Insecurity: No Food Insecurity  . Worried About Charity fundraiser in the Last Year: Never true  . Ran Out of Food in the Last Year: Never  true  Transportation Needs: No Transportation Needs  . Lack of Transportation (Medical): No  . Lack of Transportation (Non-Medical): No  Physical Activity: Insufficiently Active  . Days of Exercise per Week: 2 days  . Minutes of Exercise per Session: 30 min  Stress: No Stress Concern Present  . Feeling of Stress : Not at all  Social Connections: Not on file  Intimate Partner Violence: Not on file    Allergies  Allergen Reactions  . Hydrocodone Nausea Only    MAKES PT FEEL VERY "SICK"    Family History  Problem Relation Age of Onset  . Asthma Mother   . Heart attack Father   . Anesthesia problems Neg Hx     Prior to Admission medications    Medication Sig Start Date End Date Taking? Authorizing Provider  Ascorbic Acid (VITAMIN C PO) Take 500 mg by mouth daily.   Yes [provider]  aspirin 81 MG tablet Take 81 mg by mouth daily. Po qd (start 01-24-15)   Yes [provider]  atorvastatin (LIPITOR) 40 MG tablet TAKE 1 TABLET DAILY Patient taking differently: Take 40 mg by mouth at bedtime. 04/22/19  Yes Glendale Chard, MD  Cholecalciferol (VITAMIN D) 125 MCG (5000 UT) CAPS Take 1,000 Units by mouth daily.    Yes [provider]  ezetimibe (ZETIA) 10 MG tablet TAKE 1 TABLET EVERY DAY Patient taking differently: Take 10 mg by mouth at bedtime. 09/28/19  Yes Glendale Chard, MD  lisinopril-hydrochlorothiazide (ZESTORETIC) 10-12.5 MG tablet TAKE 1 TABLET DAILY Patient taking differently: Take 1 tablet by mouth daily. 03/10/20  Yes Glendale Chard, MD  omega-3 acid ethyl esters (LOVAZA) 1 g capsule Take 1 g by mouth 2 (two) times daily.   Yes [provider]  ondansetron (ZOFRAN-ODT) 4 MG disintegrating tablet Take 1 tablet (4 mg total) by mouth every 8 (eight) hours as needed for nausea or vomiting. 09/21/20  Yes Zigmund Gottron, NP  SYNTHROID 150 MCG tablet Take 1 tablet by mouth Monday - Saturday and 1/2 tablet on sundays Patient taking differently: Take 150 mcg by mouth See admin instructions. Take 1 tablet by mouth Monday - Saturday and 1/2 tablet on sundays 12/21/19  Yes Glendale Chard, MD    Physical Exam: Vitals:   09/22/20 0643 09/22/20 0700 09/22/20 0715 09/22/20 0838  BP:  118/69 123/66 (!) 141/70  Pulse:  70 71 74  Resp:  18 17 18   Temp: (!) 97.4 F (36.3 C)   98.1 F (36.7 C)  TempSrc: Oral   Oral  SpO2:  100% 99% 99%  Weight:      Height:         . General:  Appears calm and comfortable and is in NAD; apparent cognitive slowing . Eyes:   EOMI, normal lids, iris . ENT:  grossly normal hearing, lips & tongue, mildly dry mm; appropriate dentition . Neck:  no LAD, masses or  thyromegaly . Cardiovascular:  RRR, no m/r/g. No LE edema.  Marland Kitchen Respiratory:   CTA bilaterally with no wheezes/rales/rhonchi.  Normal respiratory effort. . Abdomen:  soft, NT, ND, NABS . Skin:  no rash or induration seen on limited exam . Musculoskeletal:  grossly normal tone BUE/BLE, good ROM, no bony abnormality . Psychiatric:  grossly normal mood and affect, speech fluent and appropriate, AOx3, mildly slow to respond to questions . Neurologic:  CN 2-12 grossly intact, moves all extremities in coordinated fashion    Radiological Exams on Admission: Independently reviewed - see  discussion in A/P where applicable  DG Abd 2 Views  Result Date: 09/21/2020 CLINICAL DATA:  Nausea, vomiting, diarrhea for 3 days EXAM: ABDOMEN - 2 VIEW COMPARISON:  None. FINDINGS: Supine and upright frontal views of the abdomen and pelvis are obtained. The bowel gas pattern is unremarkable without obstruction or ileus. There are no masses or abnormal calcifications. No free gas in the greater peritoneal sac. Lung bases are clear. IMPRESSION: 1. Unremarkable bowel gas pattern. Electronically Signed   By: Randa Ngo M.D.   On: 09/21/2020 18:39    EKG: Independently reviewed.  NSR with rate 82; low voltage; nonspecific ST changes with no evidence of acute ischemia   Labs on Admission: I have personally reviewed the available labs and imaging studies at the time of the admission.  Pertinent labs:   Na++ 118 -> 116; 135 on 12/9 Glucose 114 Normal CBC Normal UA COVID negative   Assessment/Plan Principal Problem:   Acute hyponatremia Active Problems:   Primary hypothyroidism   Class 1 obesity due to excess calories with body mass index (BMI) of 34.0 to 34.9 in adult   Rheumatoid arthritis ([place])   Hypertension   Hyperlipidemia   Diabetes mellitus without complication (HCC)   Hypovolemic hyponatremia -Etiology appears to be hypovolemic hyponatremia; patient with recent GI symptoms and increased volume  loss -Normal sodium on 12/9. -This was likely exacerbated by ongoing use of Zestoretic -Sodium decreased mildly while in ER waiting room -In an effort to prevent overcorrection (>70mEq/L/day so as to avoid central pontine myelinolysis, will slow IVF infusion to 50 cc/hr, change to D5W, and continue to monitor q4h BMP for now -Will check urinary and serum Osm as well as urinary sodium levels -Anticipate 2-3 days of hospitalization to allow for slow Na++ correction  N/V/D -Denies sick contacts, apparent food poisoning, or recent antibiotics -Possibly associated with viral GE -Will check stool studies -Gentle rehydration, as above -Will start bland diet and advance as tolerated  RA -Plaquenil is listed as a medication on her PMH but not on her [facility] - pharmacy to evaluate -Regardless, will hold for now given extensive side effect profile  Hypothyroidism -Normal TSH, elevated free T4 on 12/9 check -Continue Synthroid at current dose for now  HTN -Hold Zestoretic given hyponatremia -Will cover with IV prn hydralazine  HLD -Continue Lipitor, Zetia, ASA -Hold Lovaza due to limited inpatient utility  DM -Recent A1c was 6.0, diet controlled -Will cover with moderate-scale SSI for now without qhs coverage  Obesity Body mass index is 34.33 kg/m. -Weight loss should be encouraged -Outpatient PCP/bariatric medicine f/u encouraged    Note: This patient has been tested and is negative for the novel coronavirus COVID-19. The patient has been fully vaccinated against COVID-19.    DVT prophylaxis:   Lovenox  Code Status:  Full - confirmed with patient/family Family Communication: Daughter was present throughout evaluation Disposition Plan:  The patient is from: home  Anticipated d/c is to: home without [facility] services  Anticipated d/c date will depend on clinical response to treatment, but likely 2-3 days Patient is currently: acutely ill Consults called: None Admission status:  Admit -  It is my clinical opinion that admission to [place] is reasonable and necessary because of the expectation that this patient will require hospital care that crosses at least 2 midnights to treat this condition based on the medical complexity of the problems presented.  Given the aforementioned information, the predictability of an adverse outcome is felt to be significant.  Karmen Bongo MD [facility]   How to contact the [facility] Attending or Consulting provider [place] or covering provider during after hours [place], for this patient?  1. Check the care team in [facility] and look for a) attending/consulting TRH provider listed and b) the [facility] team listed 2. Log into [email or web address] and use [place]'s universal password to access. If you do not have the password, please contact the hospital operator. 3. Locate the [facility] provider you are looking for under [facility] and page to a number that you can be directly reached. 4. If you still have difficulty reaching the provider, please page the [facility] (Director on Call) for the Hospitalists listed on amion for assistance.   09/22/2020, 8:45 AM

## 2020-09-22 NOTE — Consult Note (Addendum)
NAME:  Hannah Castillo, MRN:  [id number], DOB:  04-Dec-1940, LOS: 0 ADMISSION DATE:  09/21/2020, CONSULTATION DATE:  09/22/20 REFERRING MD:  Dr. Lorin Mercy, CHIEF COMPLAINT:  Weakness    Brief History:  Hannah Castillo admitted 12/22 with hyponatremia.   History of Present Illness:  Hannah Castillo who presented to [facility] on 12/22 with reports of weakness, nausea, vomiting and diarrhea.    At baseline, the patient lives independently, continues to drive and is independent of all ADL's.  The patient was seen by her PCP with lab work on 12/9 for follow up of chronic issues to include HTN and hypothyroidism.  Na at that time was 135.  She was referred to Nephrology for chronic kidney disease and encouraged to drink water.  The patient reports she started drinking 64oz of water per day.  Chart review shows she runs 135-140 at baseline. She sticks to a low sodium diet.    She was seen on 12/22 at the Urgent Care for constipation and nausea that began on 12/20.  She took laxatives which helped with the constipation but then developed diarrhea after.  Additionally, she had vomiting on 12/22 and 12/23 which she relates to the constipation / laxatives.  She was COVID negative at the [place]. She was treated at the UC with zofran with improvement in symptoms.  The patient was discharged home.  After the patient was discharged home, her labs returned with a Na of 118.  The patient was called and instructed to go to the ER for evaluation.    ER labs notable for Na of 116 on 12/22. She was started on a dextrose infusion at 68ml/hr.  Follow up labs am 12/23 demonstrated a Na of 113.  Dextrose infusion stopped. The patient was started on NS at 92ml/hr.    PCCM called for evaluation of hyponatremia.   The patient reports no further nausea, vomiting.  Has had diarrhea 12/23. No fevers, chills.   She reports she has been taking her medications as prescribed.   Past Medical History:  HTN Pre-DM HLD Hypothyroidism RA - not on  medications  DJD  Significant Hospital Events:  12/22 Presented to [facility] ER with hyponatremia   Consults:    Procedures:    Significant Diagnostic Tests:    Micro Data:  COVID 12/22 >> negative   Antimicrobials:    Interim History / Subjective:  As above  RN reports pt ambulated independently to the bathroom without difficulty   Objective   Blood pressure 127/68, pulse 74, temperature 98.1 Castillo (36.7 C), temperature source Oral, resp. rate 20, height 5\' 4"  (1.626 m), weight 90.7 kg, SpO2 98 %.        Intake/Output Summary (Last 24 hours) at 09/22/2020 1438 Last data filed at 09/22/2020 1338 Gross per 24 hour  Intake 218.9 ml  Output --  Net 218.9 ml   Filed Weights   09/21/20 2140  Weight: 90.7 kg    Examination: General: pleasant elderly female lying in bed in NAD, daughter at bedside  HEENT: MM pink/moist, anicteric, pupils reactive Neuro: AAOx4, speech clear, MAE, normal strength  CV: s1s2 RRR, SR 80's on monitor, no m/r/g PULM: non-labored on RA, lungs bilaterally clear GI: soft, bsx4 active  Extremities: warm/dry, no edema  Skin: no rashes or lesions   Resolved Hospital Problem list     Assessment & Plan:   Hypotonic Euvolemic Hyponatremia  Multifactorial in the setting of thiazide diuretic use, increased oral intake of water with  concerns for kidney failure, nausea, vomiting, diarrhea and iatrogenic dextrose infusion. TSH wnl -stop thiazide diuretic  -NS at 27ml/hr  -assess Na Q4 , goal for correction no more 4-6 mEq in first 6 hours -assess cortisol, osmolality  -encourage PO intake / liberalize Na intake for now  -follow neuro exam closely  -ok to admit to PCU   Constipation  N/V/D - post laxatives -monitor stool output  -gentle hydration as above  -PRN antiemetics as above -stool panel pending   Hypoglycemia  -avoid D5 infusion > would use concentrated IV dextrose and PO if able for correction  Hypothyroidism  -synthroid per primary    Best practice (evaluated daily)  Diet: As tolerated  Pain/Anxiety/Delirium protocol (if indicated): n/a  VAP protocol (if indicated): n/a  DVT prophylaxis: per primary  GI prophylaxis: per primary  Glucose control: Hypoglycemic Mobility: As tolerated with assistance  Disposition: per TRH   Goals of Care:  Last date of multidisciplinary goals of care discussion: per primary  Family and staff present:  Summary of discussion:  Follow up goals of care discussion due:  Code Status: Full Code   Labs   CBC: Recent Labs  Lab 09/21/20 1740 09/21/20 2204  WBC 7.2 6.8  NEUTROABS 4.2  --   HGB 13.0 12.9  HCT 37.2 37.1  MCV 83.6 84.7  PLT 349 [id number]    Basic Metabolic Panel: Recent Labs  Lab 09/21/20 1740 09/21/20 2204 09/22/20 1216  NA 118* 116* 113*  K 3.6 3.7 3.7  CL 84* 82* Hannah*  CO2 24 23 22   GLUCOSE 101* 114* 156*  BUN 7* 8 5*  CREATININE 0.94 0.95 0.88  CALCIUM 10.0 10.0 9.4   GFR: Estimated Creatinine Clearance: 55.6 mL/min (by C-G formula based on SCr of 0.88 mg/dL). Recent Labs  Lab 09/21/20 1740 09/21/20 2204  WBC 7.2 6.8    Liver Function Tests: Recent Labs  Lab 09/21/20 2204  AST 34  ALT 20  ALKPHOS 104  BILITOT 1.2  PROT 6.9  ALBUMIN 3.7   Recent Labs  Lab 09/21/20 2204  LIPASE 27   No results for input(s): AMMONIA in the last 168 hours.  ABG No results found for: PHART, PCO2ART, PO2ART, HCO3, TCO2, ACIDBASEDEF, O2SAT   Coagulation Profile: No results for input(s): INR, PROTIME in the last 168 hours.  Cardiac Enzymes: No results for input(s): CKTOTAL, CKMB, CKMBINDEX, TROPONINI in the last 168 hours.  HbA1C: Hgb A1c MFr Bld  Date/Time Value Ref Range Status  09/08/2020 10:58 AM 6.0 (H) 4.8 - 5.6 % Final    Comment:             Prediabetes: 5.7 - 6.4          Diabetes: >6.4          Glycemic control for adults with diabetes: <7.0   03/09/2020 11:16 AM 5.8 (H) 4.8 - 5.6 % Final    Comment:             Prediabetes: 5.7 - 6.4           Diabetes: >6.4          Glycemic control for adults with diabetes: <7.0     CBG: Recent Labs  Lab 09/22/20 1159  GLUCAP 162*    Review of Systems: Positives in [place]   Gen: Denies fever, chills, weight change, fatigue, night sweats HEENT: Denies blurred vision, double vision, hearing loss, tinnitus, sinus congestion, rhinorrhea, sore throat, neck stiffness, dysphagia PULM: Denies shortness of  breath, cough, sputum production, hemoptysis, wheezing CV: Denies chest pain, edema, orthopnea, paroxysmal nocturnal dyspnea, palpitations GI: Denies abdominal pain, nausea, vomiting, diarrhea, hematochezia, melena, constipation, change in bowel habits GU: Denies dysuria, hematuria, polyuria, oliguria, urethral discharge Endocrine: Denies hot or cold intolerance, polyuria, polyphagia or appetite change Derm: Denies rash, dry skin, scaling or peeling skin change Heme: Denies easy bruising, bleeding, bleeding gums Neuro: Denies headache, numbness, weakness, slurred speech, loss of memory or consciousness   Past Medical History:  She,  has a past medical history of Degenerative joint disease, Diabetes mellitus without complication ([place]), History of bronchitis, Hyperlipidemia, Hypertension, Hypothyroidism, Joint pain, PONV (postoperative nausea and vomiting), and Rheumatoid arthritis(714.0).   Surgical History:   Past Surgical History:  Procedure Laterality Date   ABDOMINAL HYSTERECTOMY  1984   APPENDECTOMY  60's   CATARACT EXTRACTION Bilateral    COLONOSCOPY     THYROIDECTOMY, PARTIAL  at age 22   TOTAL KNEE ARTHROPLASTY  10/17/2011   Procedure: TOTAL KNEE ARTHROPLASTY;  Surgeon: Ninetta Lights, MD;  Location: [place];  Service: Orthopedics;  Laterality: Right;  120 MINUTES FOR THIS SURGERY   TOTAL KNEE ARTHROPLASTY Left 01/12/2015   TOTAL KNEE ARTHROPLASTY Left 01/12/2015   Procedure: TOTAL KNEE ARTHROPLASTY;  Surgeon: Kathryne Hitch, MD;  Location: [place];  Service: Orthopedics;   Laterality: Left;     Social History:   reports that she has quit smoking. She has a 20.00 pack-year smoking history. She has never used smokeless tobacco. She reports that she does not drink alcohol and does not use drugs.   Family History:  Her family history includes Asthma in her mother; Heart attack in her father. There is no history of Anesthesia problems.   Allergies Allergies  Allergen Reactions   Hydrocodone Nausea Only    MAKES PT FEEL VERY "SICK"     Home Medications  Prior to Admission medications   Medication Sig Start Date End Date Taking? Authorizing Provider  Ascorbic Acid (VITAMIN C PO) Take 500 mg by mouth daily.   Yes [provider]  aspirin Hannah MG tablet Take Hannah mg by mouth daily. Po qd (start 01-24-15)   Yes [provider]  atorvastatin (LIPITOR) 40 MG tablet TAKE 1 TABLET DAILY Patient taking differently: Take 40 mg by mouth at bedtime. 04/22/19  Yes Glendale Chard, MD  Cholecalciferol (VITAMIN D) 125 MCG (5000 UT) CAPS Take 1,000 Units by mouth daily.    Yes [provider]  ezetimibe (ZETIA) 10 MG tablet TAKE 1 TABLET EVERY DAY Patient taking differently: Take 10 mg by mouth at bedtime. 09/28/19  Yes Glendale Chard, MD  lisinopril-hydrochlorothiazide (ZESTORETIC) 10-12.5 MG tablet TAKE 1 TABLET DAILY Patient taking differently: Take 1 tablet by mouth daily. 03/10/20  Yes Glendale Chard, MD  omega-3 acid ethyl esters (LOVAZA) 1 g capsule Take 1 g by mouth 2 (two) times daily.   Yes [provider]  ondansetron (ZOFRAN-ODT) 4 MG disintegrating tablet Take 1 tablet (4 mg total) by mouth every 8 (eight) hours as needed for nausea or vomiting. 09/21/20  Yes Zigmund Gottron, NP  SYNTHROID 150 MCG tablet Take 1 tablet by mouth Monday - Saturday and 1/2 tablet on sundays Patient taking differently: Take 150 mcg by mouth See admin instructions. Take 1 tablet by mouth Monday - Saturday and 1/2 tablet on sundays 12/21/19  Yes Glendale Chard, MD     Critical care time:     Noe Gens, MSN, NP-C, AGACNP-BC [facility] 09/22/2020, 2:39 PM   Please see [email or web address] for pager details.

## 2020-09-22 NOTE — ED Notes (Signed)
Notified Dr. Lorin Mercy of critical sodium of 113.

## 2020-09-22 NOTE — ED Notes (Signed)
Lunch Tray Ordered @ 1046. 

## 2020-09-23 DIAGNOSIS — E860 Dehydration: Secondary | ICD-10-CM | POA: Diagnosis not present

## 2020-09-23 DIAGNOSIS — E119 Type 2 diabetes mellitus without complications: Secondary | ICD-10-CM | POA: Diagnosis not present

## 2020-09-23 DIAGNOSIS — E871 Hypo-osmolality and hyponatremia: Secondary | ICD-10-CM | POA: Diagnosis not present

## 2020-09-23 LAB — BASIC METABOLIC PANEL
Anion gap: 8 (ref 5–15)
Anion gap: 6 (ref 5–15)
BUN: <5 mg/dL — ABNORMAL LOW (ref 8–23)
BUN: 5 mg/dL — ABNORMAL LOW (ref 8–23)
CO2: 24 mmol/L (ref 22–32)
CO2: 26 mmol/L (ref 22–32)
Calcium: 9.5 mg/dL (ref 8.9–10.3)
Calcium: 9.3 mg/dL (ref 8.9–10.3)
Chloride: 87 mmol/L — ABNORMAL LOW (ref 98–111)
Chloride: 92 mmol/L — ABNORMAL LOW (ref 98–111)
Creatinine, Ser: 0.8 mg/dL (ref 0.44–1.00)
Creatinine, Ser: 0.92 mg/dL (ref 0.44–1.00)
GFR, Estimated: >60 mL/min (ref >60)
GFR, Estimated: >60 mL/min (ref >60)
Glucose, Bld: 94 mg/dL (ref 70–99)
Glucose, Bld: 110 mg/dL — ABNORMAL HIGH (ref 70–99)
Potassium: 3.8 mmol/L (ref 3.5–5.1)
Potassium: 3.9 mmol/L (ref 3.5–5.1)
Sodium: 119 mmol/L — CL (ref 135–145)
Sodium: 124 mmol/L — ABNORMAL LOW (ref 135–145)

## 2020-09-23 LAB — CBC
HCT: 31.3 % — ABNORMAL LOW (ref 36.0–46.0)
Hemoglobin: 11.7 g/dL — ABNORMAL LOW (ref 12.0–15.0)
MCH: 30.6 pg (ref 26.0–34.0)
MCHC: 37.4 g/dL — ABNORMAL HIGH (ref 30.0–36.0)
MCV: 81.9 fL (ref 80.0–100.0)
Platelets: 322 10*3/uL (ref 150–400)
RBC: 3.82 MIL/uL — ABNORMAL LOW (ref 3.87–5.11)
RDW: 11.8 % (ref 11.5–15.5)
WBC: 6.8 10*3/uL (ref 4.0–10.5)
nRBC: 0 % (ref 0.0–0.2)

## 2020-09-23 LAB — GLUCOSE, CAPILLARY
Glucose-Capillary: 90 mg/dL (ref 70–99)
Glucose-Capillary: 92 mg/dL (ref 70–99)
Glucose-Capillary: 91 mg/dL (ref 70–99)
Glucose-Capillary: 103 mg/dL — ABNORMAL HIGH (ref 70–99)

## 2020-09-23 NOTE — Progress Notes (Signed)
PROGRESS NOTE    Hannah Castillo   [id number]  DOB: 1941-05-25  DOA: 09/21/2020 PCP: Glendale Chard, MD   Brief Narrative:  Hannah Castillo is a 79 y.o. female with medical history significant of RA; hypothyroidism; HTN; HLD; and DM presenting with n/v/d.  The patient started with n/v/d over the weekend. She states she was constipated and when she took a laxative, she began to have both diarrhea and vomiting. Eventually the vomiting stopped but the stools continued. She was unable to eat and drink and became fatigued which led to her ED visit. In the ED, she was found to have a sodium of 118. It dropped to 113 and PCCM was consulted.   Subjective: No longer nauseated or vomiting.     Assessment & Plan:   Principal Problem:   Acute hyponatremia - due to vomiting/ diarrhea (from laxative and not infectious) in setting of diuretic use - she also states that her PCP has been recommending for the past 2 visits that she drink more water and she has been trying to - continue 1/2 NS- sodium improving steadily - I do not recommend resuming HCTZ  Active Problems:   Primary hypothyroidism - cont Synthroid    Class 1 obesity due to excess calories  - Body mass index is 34.36 kg/m.    Rheumatoid arthritis ([place])   Hypertension - currently hypotensive- follow    Hyperlipidemia - cont Lipitor and Zetia    Diabetes mellitus without complication  - not on medications- A1c on 12/9 was 6.0   Time spent in minutes: 35 DVT prophylaxis: enoxaparin (LOVENOX) injection 40 mg Start: 09/22/20 1600 Code Status: Full code Family Communication:  Disposition Plan:  Status is: Inpatient  Remains inpatient appropriate because:IV treatments appropriate due to intensity of illness or inability to take PO   Dispo: The patient is from: Home              Anticipated d/c is to: Home              Anticipated d/c date is: 1 day              Patient currently is not medically stable to  d/c.      Consultants:   PCCM Procedures:   none Antimicrobials:  Anti-infectives (From admission, onward)   None       Objective: Vitals:   09/23/20 0048 09/23/20 0413 09/23/20 0858 09/23/20 1132  BP:  (!) 101/54 100/61 (!) 109/57  Pulse:  68 80 69  Resp:  18 20 20   Temp:  98.5 F (36.9 C) 98 F (36.7 C)   TempSrc:  Oral Oral   SpO2:  100% 100% 100%  Weight: 90.8 kg     Height:        Intake/Output Summary (Last 24 hours) at 09/23/2020 1254 Last data filed at 09/23/2020 1100 Gross per 24 hour  Intake 1541.48 ml  Output 1650 ml  Net -108.52 ml   Filed Weights   09/21/20 2140 09/22/20 1957 09/23/20 0048  Weight: 90.7 kg 92.2 kg 90.8 kg    Examination: General exam: Appears comfortable  HEENT: PERRLA, oral mucosa moist, no sclera icterus or thrush Respiratory system: Clear to auscultation. Respiratory effort normal. Cardiovascular system: S1 & S2 heard, RRR.   Gastrointestinal system: Abdomen soft, non-tender, nondistended. Normal bowel sounds. Central nervous system: Alert and oriented. No focal neurological deficits. Extremities: No cyanosis, clubbing or edema Skin: No rashes or ulcers Psychiatry:  Mood & affect  appropriate.     Data Reviewed: I have personally reviewed following labs and imaging studies  CBC: Recent Labs  Lab 09/21/20 1740 09/21/20 2204 09/23/20 0124  WBC 7.2 6.8 6.8  NEUTROABS 4.2  --   --   HGB 13.0 12.9 11.7*  HCT 37.2 37.1 31.3*  MCV 83.6 84.7 81.9  PLT 349 359 725   Basic Metabolic Panel: Recent Labs  Lab 09/21/20 2204 09/22/20 1216 09/22/20 1710 09/22/20 2125 09/23/20 0124  NA 116* 113* 116* 116* 119*  K 3.7 3.7 3.8 3.8 3.8  CL 82* 81* 83* 86* 87*  CO2 23 22 22 22 24   GLUCOSE 114* 156* 89 96 94  BUN 8 5* 5* <5* <5*  CREATININE 0.95 0.88 0.82 0.88 0.80  CALCIUM 10.0 9.4 9.6 9.2 9.5   GFR: Estimated Creatinine Clearance: 60.2 mL/min (by C-G formula based on SCr of 0.8 mg/dL). Liver Function  Tests: Recent Labs  Lab 09/21/20 2204  AST 34  ALT 20  ALKPHOS 104  BILITOT 1.2  PROT 6.9  ALBUMIN 3.7   Recent Labs  Lab 09/21/20 2204  LIPASE 27   No results for input(s): AMMONIA in the last 168 hours. Coagulation Profile: No results for input(s): INR, PROTIME in the last 168 hours. Cardiac Enzymes: No results for input(s): CKTOTAL, CKMB, CKMBINDEX, TROPONINI in the last 168 hours. BNP (last 3 results) No results for input(s): PROBNP in the last 8760 hours. HbA1C: No results for input(s): HGBA1C in the last 72 hours. CBG: Recent Labs  Lab 09/22/20 1159 09/22/20 1714 09/22/20 2123 09/23/20 0616 09/23/20 1131  GLUCAP 162* 93 116* 90 92   Lipid Profile: No results for input(s): CHOL, HDL, LDLCALC, TRIG, CHOLHDL, LDLDIRECT in the last 72 hours. Thyroid Function Tests: No results for input(s): TSH, T4TOTAL, FREET4, T3FREE, THYROIDAB in the last 72 hours. Anemia Panel: No results for input(s): VITAMINB12, FOLATE, FERRITIN, TIBC, IRON, RETICCTPCT in the last 72 hours. Urine analysis:    Component Value Date/Time   COLORURINE YELLOW 12/30/2014 [place] 12/30/2014 1327   LABSPEC 1.015 09/21/2020 1806   PHURINE 7.0 09/21/2020 1806   GLUCOSEU NEGATIVE 09/21/2020 1806   HGBUR NEGATIVE 09/21/2020 1806   BILIRUBINUR NEGATIVE 09/21/2020 1806   BILIRUBINUR negative 03/04/2019 1138   KETONESUR NEGATIVE 09/21/2020 1806   PROTEINUR NEGATIVE 09/21/2020 1806   UROBILINOGEN 0.2 09/21/2020 1806   NITRITE NEGATIVE 09/21/2020 1806   LEUKOCYTESUR NEGATIVE 09/21/2020 1806   Sepsis Labs: @LABRCNTIP (procalcitonin:4,lacticidven:4) ) Recent Results (from the past 240 hour(s))  SARS CORONAVIRUS 2 (TAT 6-24 HRS) Nasopharyngeal Nasopharyngeal Swab     Status: None   Collection Time: 09/21/20  5:41 PM   Specimen: Nasopharyngeal Swab  Result Value Ref Range Status   SARS Coronavirus 2 NEGATIVE NEGATIVE Final    Comment: (NOTE) SARS-CoV-2 target nucleic acids are  NOT DETECTED.  The SARS-CoV-2 RNA is generally detectable in upper and lower respiratory specimens during the acute phase of infection. Negative results do not preclude SARS-CoV-2 infection, do not rule out co-infections with other pathogens, and should not be used as the sole basis for treatment or other patient management decisions. Negative results must be combined with clinical observations, patient history, and epidemiological information. The expected result is Negative.  Fact Sheet for Patients: SugarRoll.be  Fact Sheet for Healthcare Providers: https://www.woods-mathews.com/  This test is not yet approved or cleared by the [place] FDA and  has been authorized for detection and/or diagnosis of SARS-CoV-2 by FDA under an Emergency Use  Authorization (EUA). This EUA will remain  in effect (meaning this test can be used) for the duration of the COVID-19 declaration under Se ction 564(b)(1) of the Act, 21 U.S.C. section 360bbb-3(b)(1), unless the authorization is terminated or revoked sooner.  Performed at [facility], [place] [address], [place], [place] [address]          Radiology Studies: DG Abd 2 Views  Result Date: 09/21/2020 CLINICAL DATA:  Nausea, vomiting, diarrhea for 3 days EXAM: ABDOMEN - 2 VIEW COMPARISON:  None. FINDINGS: Supine and upright frontal views of the abdomen and pelvis are obtained. The bowel gas pattern is unremarkable without obstruction or ileus. There are no masses or abnormal calcifications. No free gas in the greater peritoneal sac. Lung bases are clear. IMPRESSION: 1. Unremarkable bowel gas pattern. Electronically Signed   By: Randa Ngo M.D.   On: 09/21/2020 18:39      Scheduled Meds: . aspirin  81 mg Oral Daily  . atorvastatin  40 mg Oral QHS  . enoxaparin (LOVENOX) injection  40 mg Subcutaneous Q24H  . ezetimibe  10 mg Oral QHS  . insulin aspart  0-15 Units Subcutaneous TID WC  .  levothyroxine  150 mcg Oral Once per day on Mon Tue Wed Thu Fri Sat  . [START ON 09/25/2020] levothyroxine  75 mcg Oral Once per day on Sun  . sodium chloride flush  3 mL Intravenous Q12H   Continuous Infusions: . sodium chloride 75 mL/hr at 09/23/20 0905     LOS: 1 day      Debbe Odea, MD [facility] Pager: [email or web address] 09/23/2020, 12:54 PM

## 2020-09-23 NOTE — Plan of Care (Signed)

## 2020-09-23 NOTE — Progress Notes (Signed)
NAME:  Hannah Castillo, MRN:  [id number], DOB:  January 18, 1941, LOS: 1 ADMISSION DATE:  09/21/2020, CONSULTATION DATE:  09/22/20 REFERRING MD:  Dr. Lorin Mercy, CHIEF COMPLAINT:  Weakness    Brief History:  79 y/o F admitted 12/22 with hyponatremia.   History of Present Illness:  79 y/o F who presented to [facility] on 12/22 with reports of weakness, nausea, vomiting and diarrhea.    At baseline, the patient lives independently, continues to drive and is independent of all ADL's.  The patient was seen by her PCP with lab work on 12/9 for follow up of chronic issues to include HTN and hypothyroidism.  Na at that time was 135.  She was referred to Nephrology for chronic kidney disease and encouraged to drink water.  The patient reports she started drinking 64oz of water per day.  Chart review shows she runs 135-140 at baseline. She sticks to a low sodium diet.    She was seen on 12/22 at the Urgent Care for constipation and nausea that began on 12/20.  She took laxatives which helped with the constipation but then developed diarrhea after.  Additionally, she had vomiting on 12/22 and 12/23 which she relates to the constipation / laxatives.  She was COVID negative at the [employer]. She was treated at the UC with zofran with improvement in symptoms.  The patient was discharged home.  After the patient was discharged home, her labs returned with a Na of 118.  The patient was called and instructed to go to the ER for evaluation.    ER labs notable for Na of 116 on 12/22. She was started on a dextrose infusion at 38ml/hr.  Follow up labs am 12/23 demonstrated a Na of 113.  Dextrose infusion stopped. The patient was started on NS at 56ml/hr.    PCCM called for evaluation of hyponatremia.   The patient reports no further nausea, vomiting.  Has had diarrhea 12/23. No fevers, chills.   She reports she has been taking her medications as prescribed.   Past Medical History:  HTN Pre-DM HLD Hypothyroidism RA - not on  medications  DJD  Significant Hospital Events:  12/22 Presented to [facility] ER with hyponatremia   Consults:    Procedures:    Significant Diagnostic Tests:    Micro Data:  COVID 12/22 >> negative   Antimicrobials:    Interim History / Subjective:  Awake alert no acute distress, pulmonary critical care will sign off Objective   Blood pressure 100/61, pulse 80, temperature 98 F (36.7 C), temperature source Oral, resp. rate 20, height 5\' 4"  (1.626 m), weight 90.8 kg, SpO2 100 %.        Intake/Output Summary (Last 24 hours) at 09/23/2020 0905 Last data filed at 09/23/2020 3664 Gross per 24 hour  Intake 1381.48 ml  Output 1650 ml  Net -268.52 ml   Filed Weights   09/21/20 2140 09/22/20 1957 09/23/20 0048  Weight: 90.7 kg 92.2 kg 90.8 kg    Examination: General: Well nourished well female no acute distress HEENT: Edema lymphadenopathy is appreciated Neuro: Tach without focal defect CV: Heart sounds are regular lower PULM: Diminished in the bases  GI: soft, bsx4 active  Extremities: warm/dry, 1+ edema  Skin: no rashes or lesions    Resolved Hospital Problem list     Assessment & Plan:   Hypotonic Euvolemic Hyponatremia  Recent Labs  Lab 09/22/20 1710 09/22/20 2125 09/23/20 0124  NA 116* 116* 119*    Most likely multifactorial  Resolving 09/23/2020 pulmonary critical care will sign off  Constipation  N/V/D - post laxatives Per primary  Hypoglycemia  Per primary  Hypothyroidism  Per primary  Best practice (evaluated daily)  Diet: As tolerated  Pain/Anxiety/Delirium protocol (if indicated): n/a  VAP protocol (if indicated): n/a  DVT prophylaxis: per primary  GI prophylaxis: per primary  Glucose control: Hypoglycemic Mobility: As tolerated with assistance  Disposition: per TRH   Goals of Care:  Last date of multidisciplinary goals of care discussion: per primary  Family and staff present:  Summary of discussion:  Follow up goals of care  discussion due:  Code Status: Full Code   Labs   CBC: Recent Labs  Lab 09/21/20 1740 09/21/20 2204 09/23/20 0124  WBC 7.2 6.8 6.8  NEUTROABS 4.2  --   --   HGB 13.0 12.9 11.7*  HCT 37.2 37.1 31.3*  MCV 83.6 84.7 81.9  PLT 349 359 [id number]    Basic Metabolic Panel: Recent Labs  Lab 09/21/20 2204 09/22/20 1216 09/22/20 1710 09/22/20 2125 09/23/20 0124  NA 116* 113* 116* 116* 119*  K 3.7 3.7 3.8 3.8 3.8  CL 82* 81* 83* 86* 87*  CO2 23 22 22 22 24   GLUCOSE 114* 156* 89 96 94  BUN 8 5* 5* <5* <5*  CREATININE 0.95 0.88 0.82 0.88 0.80  CALCIUM 10.0 9.4 9.6 9.2 9.5   GFR: Estimated Creatinine Clearance: 60.2 mL/min (by C-G formula based on SCr of 0.8 mg/dL). Recent Labs  Lab 09/21/20 1740 09/21/20 2204 09/23/20 0124  WBC 7.2 6.8 6.8    Liver Function Tests: Recent Labs  Lab 09/21/20 2204  AST 34  ALT 20  ALKPHOS 104  BILITOT 1.2  PROT 6.9  ALBUMIN 3.7   Recent Labs  Lab 09/21/20 2204  LIPASE 27   No results for input(s): AMMONIA in the last 168 hours.  ABG No results found for: PHART, PCO2ART, PO2ART, HCO3, TCO2, ACIDBASEDEF, O2SAT   Coagulation Profile: No results for input(s): INR, PROTIME in the last 168 hours.  Cardiac Enzymes: No results for input(s): CKTOTAL, CKMB, CKMBINDEX, TROPONINI in the last 168 hours.  HbA1C: Hgb A1c MFr Bld  Date/Time Value Ref Range Status  09/08/2020 10:58 AM 6.0 (H) 4.8 - 5.6 % Final    Comment:             Prediabetes: 5.7 - 6.4          Diabetes: >6.4          Glycemic control for adults with diabetes: <7.0   03/09/2020 11:16 AM 5.8 (H) 4.8 - 5.6 % Final    Comment:             Prediabetes: 5.7 - 6.4          Diabetes: >6.4          Glycemic control for adults with diabetes: <7.0     CBG: Recent Labs  Lab 09/22/20 1159 09/22/20 1714 09/22/20 2123 09/23/20 [place]     Steve Genna Casimir ACNP Acute Care Nurse Practitioner [place] Please consult  Amion 09/23/2020, 9:05 AM

## 2020-09-24 DIAGNOSIS — E039 Hypothyroidism, unspecified: Secondary | ICD-10-CM | POA: Diagnosis not present

## 2020-09-24 DIAGNOSIS — E871 Hypo-osmolality and hyponatremia: Secondary | ICD-10-CM | POA: Diagnosis not present

## 2020-09-24 LAB — BASIC METABOLIC PANEL
Anion gap: 10 (ref 5–15)
Anion gap: 10 (ref 5–15)
BUN: 5 mg/dL — ABNORMAL LOW (ref 8–23)
BUN: 6 mg/dL — ABNORMAL LOW (ref 8–23)
CO2: 23 mmol/L (ref 22–32)
CO2: 25 mmol/L (ref 22–32)
Calcium: 9.6 mg/dL (ref 8.9–10.3)
Calcium: 9.8 mg/dL (ref 8.9–10.3)
Chloride: 94 mmol/L — ABNORMAL LOW (ref 98–111)
Chloride: 95 mmol/L — ABNORMAL LOW (ref 98–111)
Creatinine, Ser: 0.91 mg/dL (ref 0.44–1.00)
Creatinine, Ser: 0.99 mg/dL (ref 0.44–1.00)
GFR, Estimated: >60 mL/min (ref >60)
GFR, Estimated: 57 mL/min — ABNORMAL LOW (ref >60)
Glucose, Bld: 88 mg/dL (ref 70–99)
Glucose, Bld: 104 mg/dL — ABNORMAL HIGH (ref 70–99)
Potassium: 4 mmol/L (ref 3.5–5.1)
Potassium: 4.3 mmol/L (ref 3.5–5.1)
Sodium: 127 mmol/L — ABNORMAL LOW (ref 135–145)
Sodium: 130 mmol/L — ABNORMAL LOW (ref 135–145)

## 2020-09-24 LAB — GLUCOSE, CAPILLARY
Glucose-Capillary: 85 mg/dL (ref 70–99)
Glucose-Capillary: 84 mg/dL (ref 70–99)
Glucose-Capillary: 86 mg/dL (ref 70–99)
Glucose-Capillary: 140 mg/dL — ABNORMAL HIGH (ref 70–99)

## 2020-09-24 LAB — T4, FREE: Free T4: 1.95 ng/dL — ABNORMAL HIGH (ref 0.61–1.12)

## 2020-09-24 LAB — TSH: TSH: 0.467 u[IU]/mL (ref 0.350–4.500)

## 2020-09-24 MED ORDER — SODIUM CHLORIDE 0.9 % IV SOLN: INTRAVENOUS | Status: DC

## 2020-09-24 MED ORDER — POLYETHYLENE GLYCOL 3350 17 G PO PACK: 17.0000 g | PACK | Freq: Every day | ORAL | Status: DC

## 2020-09-24 MED ORDER — BISACODYL 5 MG PO TBEC
10.0000 mg | DELAYED_RELEASE_TABLET | Freq: Every day | ORAL | Status: DC | PRN
  Administered 2020-09-24 – 2020-09-25 (×2): 10 mg via ORAL
  Filled 2020-09-24 (×2): qty 2

## 2020-09-24 MED ORDER — POLYETHYLENE GLYCOL 3350 17 G PO PACK
17.0000 g | PACK | Freq: Every day | ORAL | Status: DC | PRN
  Administered 2020-09-24 – 2020-09-25 (×2): 17 g via ORAL
  Filled 2020-09-24 (×2): qty 1

## 2020-09-24 NOTE — Progress Notes (Addendum)
PROGRESS NOTE    Hannah Castillo   YSA:[id number]  DOB: 11-06-1940  DOA: 09/21/2020 PCP: Glendale Chard, MD   Brief Narrative:  Hannah Castillo is a 79 y.o. female with medical history significant of RA; hypothyroidism; HTN; HLD; and DM presenting with n/v/d.  The patient started with n/v/d over the weekend. She states she was constipated and when she took a laxative, she began to have both diarrhea and vomiting. Eventually the vomiting stopped but the stools continued. She was unable to eat and drink and became fatigued which led to her ED visit. In the ED, she was found to have a sodium of 118. It dropped to 113 and PCCM was consulted.   Subjective: Feels weak but not as severe as before. No other complaints.     Assessment & Plan:   Principal Problem:   Acute hyponatremia - due to vomiting/ diarrhea (from laxative and not infectious) in setting of diuretic use - she also states that her PCP has been recommending for the past 2 visits that she drink more water and she has been trying to - sodium improving steadily- cont NS at 50 cc/her and follow sodium later today - I do not recommend resuming HCTZ  Active Problems:   Primary hypothyroidism - cont Synthroid- because of above hyponatremia, recheck thyroid panel    Class 1 obesity due to excess calories  - Body mass index is 34.02 kg/m.     Hypertension - currently hypotensive- Holding Zestoretic    Hyperlipidemia - cont Lipitor and Zetia    Diabetes mellitus without complication  - not on medications- A1c on 12/9 was 6.0  Rheumatoid Arthritis - deformities of fingers consistent with RA noted on exam   Time spent in minutes: 35 DVT prophylaxis: enoxaparin (LOVENOX) injection 40 mg Start: 09/22/20 1600 Code Status: Full code Family Communication:  Disposition Plan:  Status is: Inpatient  Remains inpatient appropriate because:IV treatments appropriate due to intensity of illness or inability to take  PO   Dispo: The patient is from: Home              Anticipated d/c is to: Home              Anticipated d/c date is: 1 day              Patient currently is not medically stable to d/c.      Consultants:   PCCM Procedures:   none Antimicrobials:  Anti-infectives (From admission, onward)   None       Objective: Vitals:   09/24/20 0321 09/24/20 0458 09/24/20 0939 09/24/20 1130  BP:  (!) 111/49 (!) 146/70 117/69  Pulse:  65 81 71  Resp:  16 17 17   Temp:  97.9 F (36.6 C) 97.7 F (36.5 C) 98.2 F (36.8 C)  TempSrc:  Oral Oral Oral  SpO2:  100% 100% 100%  Weight: 89.9 kg     Height:        Intake/Output Summary (Last 24 hours) at 09/24/2020 1147 Last data filed at 09/24/2020 0500 Gross per 24 hour  Intake 1320 ml  Output 1900 ml  Net -580 ml   Filed Weights   09/22/20 1957 09/23/20 0048 09/24/20 0321  Weight: 92.2 kg 90.8 kg 89.9 kg    Examination: General exam: Appears comfortable  HEENT: PERRLA, oral mucosa moist, no sclera icterus or thrush Respiratory system: Clear to auscultation. Respiratory effort normal. Cardiovascular system: S1 & S2 heard,  No murmurs  Gastrointestinal  system: Abdomen soft, non-tender, nondistended. Normal bowel sounds   Central nervous system: Alert and oriented. No focal neurological deficits. Extremities: No cyanosis, clubbing or edema Skin: No rashes or ulcers Psychiatry:  Mood & affect appropriate.     Data Reviewed: I have personally reviewed following labs and imaging studies  CBC: Recent Labs  Lab 09/21/20 1740 09/21/20 2204 09/23/20 0124  WBC 7.2 6.8 6.8  NEUTROABS 4.2  --   --   HGB 13.0 12.9 11.7*  HCT 37.2 37.1 31.3*  MCV 83.6 84.7 81.9  PLT 349 359 322   Basic Metabolic Panel: Recent Labs  Lab 09/22/20 1710 09/22/20 2125 09/23/20 0124 09/23/20 1402 09/24/20 0241  NA 116* 116* 119* 124* 127*  K 3.8 3.8 3.8 3.9 4.0  CL 83* 86* 87* 92* 94*  CO2 22 22 24 26 23   GLUCOSE 89 96 94 110* 88  BUN 5*  <5* <5* 5* 5*  CREATININE 0.82 0.88 0.80 0.92 0.91  CALCIUM 9.6 9.2 9.5 9.3 9.6   GFR: Estimated Creatinine Clearance: 52.7 mL/min (by C-G formula based on SCr of 0.91 mg/dL). Liver Function Tests: Recent Labs  Lab 09/21/20 2204  AST 34  ALT 20  ALKPHOS 104  BILITOT 1.2  PROT 6.9  ALBUMIN 3.7   Recent Labs  Lab 09/21/20 2204  LIPASE 27   No results for input(s): AMMONIA in the last 168 hours. Coagulation Profile: No results for input(s): INR, PROTIME in the last 168 hours. Cardiac Enzymes: No results for input(s): CKTOTAL, CKMB, CKMBINDEX, TROPONINI in the last 168 hours. BNP (last 3 results) No results for input(s): PROBNP in the last 8760 hours. HbA1C: No results for input(s): HGBA1C in the last 72 hours. CBG: Recent Labs  Lab 09/23/20 1131 09/23/20 1624 09/23/20 2104 09/24/20 0632 09/24/20 1130  GLUCAP 92 91 103* 85 84   Lipid Profile: No results for input(s): CHOL, HDL, LDLCALC, TRIG, CHOLHDL, LDLDIRECT in the last 72 hours. Thyroid Function Tests: No results for input(s): TSH, T4TOTAL, FREET4, T3FREE, THYROIDAB in the last 72 hours. Anemia Panel: No results for input(s): VITAMINB12, FOLATE, FERRITIN, TIBC, IRON, RETICCTPCT in the last 72 hours. Urine analysis:    Component Value Date/Time   COLORURINE YELLOW 12/30/2014 1327   APPEARANCEUR CLEAR 12/30/2014 1327   LABSPEC 1.015 09/21/2020 1806   PHURINE 7.0 09/21/2020 1806   GLUCOSEU NEGATIVE 09/21/2020 1806   HGBUR NEGATIVE 09/21/2020 1806   BILIRUBINUR NEGATIVE 09/21/2020 1806   BILIRUBINUR negative 03/04/2019 1138   KETONESUR NEGATIVE 09/21/2020 1806   PROTEINUR NEGATIVE 09/21/2020 1806   UROBILINOGEN 0.2 09/21/2020 1806   NITRITE NEGATIVE 09/21/2020 1806   LEUKOCYTESUR NEGATIVE 09/21/2020 1806   Sepsis Labs: @LABRCNTIP (procalcitonin:4,lacticidven:4) ) Recent Results (from the past 240 hour(s))  SARS CORONAVIRUS 2 (TAT 6-24 HRS) Nasopharyngeal Nasopharyngeal Swab     Status: None   Collection  Time: 09/21/20  5:41 PM   Specimen: Nasopharyngeal Swab  Result Value Ref Range Status   SARS Coronavirus 2 NEGATIVE NEGATIVE Final    Comment: (NOTE) SARS-CoV-2 target nucleic acids are NOT DETECTED.  The SARS-CoV-2 RNA is generally detectable in upper and lower respiratory specimens during the acute phase of infection. Negative results do not preclude SARS-CoV-2 infection, do not rule out co-infections with other pathogens, and should not be used as the sole basis for treatment or other patient management decisions. Negative results must be combined with clinical observations, patient history, and epidemiological information. The expected result is Negative.  Fact Sheet for Patients:  Fact  Sheet for Healthcare Providers: https://www.woods-mathews.com/  This test is not yet approved or cleared by the [place] FDA and  has been authorized for detection and/or diagnosis of SARS-CoV-2 by FDA under an Emergency Use Authorization (EUA). This EUA will remain  in effect (meaning this test can be used) for the duration of the COVID-19 declaration under Se ction 564(b)(1) of the Act, 21 U.S.C. section 360bbb-3(b)(1), unless the authorization is terminated or revoked sooner.  Performed at [facility], [place] [address] [place], [place] [address]          Radiology Studies: No results found.    Scheduled Meds: . aspirin  81 mg Oral Daily  . atorvastatin  40 mg Oral QHS  . enoxaparin (LOVENOX) injection  40 mg Subcutaneous Q24H  . ezetimibe  10 mg Oral QHS  . insulin aspart  0-15 Units Subcutaneous TID WC  . levothyroxine  150 mcg Oral Once per day on Mon Tue Wed Thu Fri Sat  . [START ON 09/25/2020] levothyroxine  75 mcg Oral Once per day on Sun  . sodium chloride flush  3 mL Intravenous Q12H   Continuous Infusions: . sodium chloride       LOS: 2 days      Debbe Odea, MD [facility] Pager: [email or web address] 09/24/2020, 11:47 AM

## 2020-09-25 DIAGNOSIS — E039 Hypothyroidism, unspecified: Secondary | ICD-10-CM | POA: Diagnosis not present

## 2020-09-25 DIAGNOSIS — E871 Hypo-osmolality and hyponatremia: Secondary | ICD-10-CM | POA: Diagnosis not present

## 2020-09-25 LAB — BASIC METABOLIC PANEL
Anion gap: 7 (ref 5–15)
Anion gap: 8 (ref 5–15)
BUN: 6 mg/dL — ABNORMAL LOW (ref 8–23)
BUN: 5 mg/dL — ABNORMAL LOW (ref 8–23)
CO2: 22 mmol/L (ref 22–32)
CO2: 24 mmol/L (ref 22–32)
Calcium: 9.2 mg/dL (ref 8.9–10.3)
Calcium: 9.6 mg/dL (ref 8.9–10.3)
Chloride: 98 mmol/L (ref 98–111)
Chloride: 96 mmol/L — ABNORMAL LOW (ref 98–111)
Creatinine, Ser: 0.78 mg/dL (ref 0.44–1.00)
Creatinine, Ser: 0.94 mg/dL (ref 0.44–1.00)
GFR, Estimated: >60 mL/min (ref >60)
GFR, Estimated: >60 mL/min (ref >60)
Glucose, Bld: 73 mg/dL (ref 70–99)
Glucose, Bld: 110 mg/dL — ABNORMAL HIGH (ref 70–99)
Potassium: 4 mmol/L (ref 3.5–5.1)
Potassium: 4.2 mmol/L (ref 3.5–5.1)
Sodium: 127 mmol/L — ABNORMAL LOW (ref 135–145)
Sodium: 128 mmol/L — ABNORMAL LOW (ref 135–145)

## 2020-09-25 LAB — GLUCOSE, CAPILLARY
Glucose-Capillary: 83 mg/dL (ref 70–99)
Glucose-Capillary: 113 mg/dL — ABNORMAL HIGH (ref 70–99)
Glucose-Capillary: 77 mg/dL (ref 70–99)
Glucose-Capillary: 111 mg/dL — ABNORMAL HIGH (ref 70–99)

## 2020-09-25 LAB — OSMOLALITY: Osmolality: 241 mOsm/kg — CL (ref 275–295)

## 2020-09-25 NOTE — Progress Notes (Signed)
PROGRESS NOTE    Hannah Castillo   EUM:[id number]  DOB: 15-Nov-1940  DOA: 09/21/2020 PCP: Glendale Chard, MD   Brief Narrative:  Hannah Castillo is a 79 y.o. female with medical history significant of RA; hypothyroidism; HTN; HLD; and DM presenting with n/v/d.  The patient started with n/v/d over the weekend. She states she was constipated and when she took a laxative, she began to have both diarrhea and vomiting. Eventually the vomiting stopped but the stools continued. She was unable to eat and drink and became fatigued which led to her ED visit. In the ED, she was found to have a sodium of 118. It dropped to 113 and PCCM was consulted.   Subjective: She has no complaints today.     Assessment & Plan:   Principal Problem:   Acute hyponatremia - due to vomiting/ diarrhea (from laxative and not infectious) in setting of diuretic use - she also states that her PCP has been recommending for the past 2 visits that she drink more water and she has been trying to - sodium improving steadily and is 127 today- cont NS at 50 cc/her and follow sodium tomorrow - I do not recommend resuming HCTZ  Active Problems:   Primary hypothyroidism - cont Synthroid-     Class 1 obesity due to excess calories  - Body mass index is 34.14 kg/m.     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      Consultants:   PCCM Procedures:   none Antimicrobials:  Anti-infectives (From admission, onward)   None       Objective: Vitals:   09/25/20 0137 09/25/20 0428 09/25/20 0801 09/25/20 1143  BP:  (!) 113/53 116/67 126/64  Pulse:  72 78 77  Resp:  17 18 18   Temp:  99 F (37.2 C) 98.3 F (36.8 C) 98.4 F (36.9 C)  TempSrc:  Oral Oral Oral  SpO2:  100% 100% 98%  Weight: 90.2 kg     Height:        Intake/Output Summary (Last 24 hours) at 09/25/2020 1211 Last data filed at 09/25/2020 0900 Gross per 24 hour  Intake 1628.35 ml  Output 2850 ml  Net -1221.65 ml   Filed Weights   09/23/20 0048 09/24/20 0321 09/25/20 0137  Weight: 90.8 kg 89.9 kg 90.2 kg    Examination: General exam: Appears comfortable  HEENT: PERRLA, oral mucosa moist, no sclera icterus or thrush Respiratory system: Clear to auscultation. Respiratory effort normal. Cardiovascular system: S1 & S2 heard,  No murmurs  Gastrointestinal system: Abdomen soft, non-tender, nondistended. Normal bowel sounds  Central nervous system: Alert and oriented. No focal neurological deficits. Extremities: No cyanosis, clubbing or edema Skin: No rashes or ulcers Psychiatry:  Mood & affect appropriate.      Data Reviewed: I have personally reviewed following labs and imaging studies  CBC: Recent Labs  Lab 09/21/20 1740 09/21/20 2204 09/23/20 0124  WBC 7.2 6.8 6.8  NEUTROABS 4.2  --   --   HGB 13.0 12.9 11.7*  HCT 37.2 37.1 31.3*  MCV 83.6 84.7 81.9  PLT 349 359 322   Basic Metabolic Panel: Recent Labs  Lab 09/23/20 1402 09/24/20 0241 09/24/20 1603 09/25/20 0344 09/25/20 0944  NA 124* 127* 130* 127* 128*  K 3.9 4.0 4.3 4.0 4.2  CL 92* 94* 95* 98 96*  CO2 26 23 25 22 24   GLUCOSE 110* 88 104* 73 110*  BUN 5* 5* 6* 6* 5*  CREATININE 0.92 0.91 0.99 0.78 0.94  CALCIUM 9.3 9.6 9.8  9.2 9.6   GFR: Estimated Creatinine Clearance: 51.1 mL/min (by C-G formula based on SCr of 0.94 mg/dL). Liver Function Tests: Recent Labs  Lab 09/21/20 2204  AST 34  ALT 20  ALKPHOS 104  BILITOT 1.2  PROT 6.9  ALBUMIN 3.7   Recent Labs  Lab 09/21/20 2204  LIPASE 27   No results for input(s): AMMONIA in the last 168 hours. Coagulation Profile: No results for input(s): INR, PROTIME in the last 168 hours. Cardiac Enzymes: No results for input(s): CKTOTAL, CKMB, CKMBINDEX, TROPONINI in the last 168 hours. BNP (last 3 results) No results for input(s): PROBNP in the last 8760 hours. HbA1C: No results for input(s): HGBA1C in the last 72 hours. CBG: Recent Labs  Lab 09/24/20 1130 09/24/20 1615 09/24/20 2116 09/25/20 0611 09/25/20 1140  GLUCAP 84 86 140* 83 113*   Lipid Profile: No results for input(s): CHOL, HDL, LDLCALC, TRIG, CHOLHDL, LDLDIRECT in the last 72 hours. Thyroid Function Tests: Recent Labs    09/24/20 1603  TSH 0.467  FREET4 1.95*   Anemia Panel: No results for input(s): VITAMINB12, FOLATE, FERRITIN, TIBC, IRON, RETICCTPCT in the last 72 hours. Urine analysis:    Component Value Date/Time   COLORURINE YELLOW 12/30/2014 1327   APPEARANCEUR CLEAR 12/30/2014 1327   LABSPEC 1.015 09/21/2020 1806   PHURINE 7.0 09/21/2020 1806   GLUCOSEU NEGATIVE 09/21/2020 1806   HGBUR NEGATIVE 09/21/2020 1806   BILIRUBINUR NEGATIVE 09/21/2020 1806   BILIRUBINUR negative 03/04/2019 1138   KETONESUR NEGATIVE 09/21/2020 1806   PROTEINUR NEGATIVE 09/21/2020 1806   UROBILINOGEN 0.2 09/21/2020 1806   NITRITE NEGATIVE 09/21/2020 1806   LEUKOCYTESUR NEGATIVE 09/21/2020 1806   Sepsis Labs: @LABRCNTIP (procalcitonin:4,lacticidven:4) ) Recent Results (from the past 240 hour(s))  SARS CORONAVIRUS 2 (TAT 6-24 HRS) Nasopharyngeal Nasopharyngeal Swab     Status: None   Collection Time: 09/21/20  5:41 PM   Specimen: Nasopharyngeal Swab  Result Value Ref Range Status   SARS  Coronavirus 2 NEGATIVE NEGATIVE Final    Comment: (NOTE) SARS-CoV-2 target nucleic acids are NOT DETECTED.  The SARS-CoV-2 RNA is generally detectable in upper and lower respiratory specimens during the acute phase of infection. Negative results do not preclude SARS-CoV-2 infection, do not rule out co-infections with other pathogens, and should not be used as the sole basis for treatment or other patient management decisions. Negative results must be combined with clinical observations, patient history, and epidemiological information. The expected result is Negative.  Fact Sheet for Patients:  Fact Sheet for Healthcare Providers: 09/23/20  This test  is not yet approved or cleared by the [place] and  has been authorized for detection and/or diagnosis of SARS-CoV-2 by FDA under an Emergency Use Authorization (EUA). This EUA will remain  in effect (meaning this test can be used) for the duration of the COVID-19 declaration under Se ction 564(b)(1) of the Act, 21 U.S.C. section 360bbb-3(b)(1), unless the authorization is terminated or revoked sooner.  Performed at [facility], [place] [address], [place], [place] [address]          Radiology Studies: No results found.    Scheduled Meds: . aspirin  81 mg Oral Daily  . atorvastatin  40 mg Oral QHS  . enoxaparin (LOVENOX) injection  40 mg Subcutaneous Q24H  . ezetimibe  10 mg Oral QHS  . insulin aspart  0-15 Units Subcutaneous TID WC  . levothyroxine  150 mcg Oral Once per day on Mon Tue Wed Thu Fri Sat  . levothyroxine  75 mcg Oral Once per day on Sun  . sodium chloride flush  3 mL Intravenous Q12H   Continuous Infusions: . sodium chloride 50 mL/hr at 09/24/20 2303     LOS: 3 days      Debbe Odea, MD [facility] Pager: [email or web address] 09/25/2020, 12:11 PM

## 2020-09-26 ENCOUNTER — Telehealth: Payer: Self-pay

## 2020-09-26 DIAGNOSIS — E871 Hypo-osmolality and hyponatremia: Secondary | ICD-10-CM | POA: Diagnosis not present

## 2020-09-26 LAB — BASIC METABOLIC PANEL
Anion gap: 9 (ref 5–15)
BUN: 5 mg/dL — ABNORMAL LOW (ref 8–23)
CO2: 24 mmol/L (ref 22–32)
Calcium: 9.6 mg/dL (ref 8.9–10.3)
Chloride: 99 mmol/L (ref 98–111)
Creatinine, Ser: 0.9 mg/dL (ref 0.44–1.00)
GFR, Estimated: >60 mL/min (ref >60)
Glucose, Bld: 93 mg/dL (ref 70–99)
Potassium: 4 mmol/L (ref 3.5–5.1)
Sodium: 132 mmol/L — ABNORMAL LOW (ref 135–145)

## 2020-09-26 LAB — GLUCOSE, CAPILLARY: Glucose-Capillary: 84 mg/dL (ref 70–99)

## 2020-09-26 MED ORDER — LISINOPRIL 10 MG PO TABS: 10.0000 mg | ORAL_TABLET | Freq: Every day | ORAL | 11 refills | Status: DC

## 2020-09-26 NOTE — Plan of Care (Signed)

## 2020-09-26 NOTE — Telephone Encounter (Signed)
I called the pt to check and see how she is doing, to do a transition of care call and to also schedule her a hospital f/u.

## 2020-09-26 NOTE — Progress Notes (Signed)
D/C instructions given and reviewed. No questions asked but encouraged to call with any concerns. Daughter will transport home via private vehicle.

## 2020-09-26 NOTE — Discharge Instructions (Signed)

## 2020-09-26 NOTE — Telephone Encounter (Signed)
I called and did a transition of care call on the pt and tried to schedule an appt for a hospital f/u with the pt for this week.  The pt said she wanted to wait and see Dr Allyne Gee when she returns to the office.,  The pt was told that Dr Allyne Gee doesn't have any openings next week.  The pt said that she wants to see Dr Allyne Gee because her meds was changed when she was at the hospital.

## 2020-09-26 NOTE — Telephone Encounter (Signed)
Transition Care Management Follow-up Telephone Call  Date of discharge and from where12/27/2021 [facility]   How have you been since you were r/eleased from the hospital? good  Any questions or concerns? The pt said that she had to go and get plain lisinopril because she can't take the HCTZ because it was removing the water and salt from her body;  Items Reviewed:  Did the pt receive and understand the discharge instructions provided? Yes  Medications obtained and verified? yes  Any new allergies since your discharge? no  Dietary orders reviewed? No special oders  Do you have support at home? Yes  Other (ie: DME, Home Health, etc) n/a  Functional Questionnaire: (I = Independent and D = Dependent)  Bathing/Dressing- I   Meal Prep- I  Eating- I  Maintaining continence- I  Transferring/Ambulation- I  Managing Meds-    Follow up appointments reviewed:    PCP Hospital f/u appt confirmed? No the pt declined to see an NP and said she would wait for Dr. Allyne Gee next available appointment.   Specialist Hospital f/u appt confirmed? n/a  Are transportation arrangements needed? No  If their condition worsens, is the pt aware to call  their PCP or go to the ED? yes  Was the patient provided with contact information for the PCP's office or ED? yes  Was the pt encouraged to call back with questions or concerns? yes

## 2020-09-26 NOTE — Discharge Summary (Signed)
Physician Discharge Summary  EZELLA Castillo [id number] DOB: 04/30/1941 DOA: 09/21/2020  PCP: Glendale Chard, MD  Admit date: 09/21/2020 Discharge date: 09/26/2020  Admitted From: home  Disposition:  home   Recommendations for Outpatient Follow-up:  1. Sodium level in 1 wk please- do not resume HCTZ  Home Health:  none  Discharge Condition:  stable   CODE STATUS:  Full code   Consultations:  none  Procedures/Studies: .    Discharge Diagnoses:  Principal Problem:   Acute hyponatremia Active Problems:   Primary hypothyroidism   Class 1 obesity due to excess calories with body mass index (BMI) of 34.0 to 34.9 in adult   Rheumatoid arthritis ([place])   Hypertension   Hyperlipidemia   Diabetes mellitus without complication ([place])     Brief Summary: Hannah Castillo is a 79 y.o.femalewith medical history significant ofRA; hypothyroidism; HTN; HLD; and DM presenting with n/v/d.The patient started with n/v/d over the weekend. She states she was constipated and when she took a laxative, she began to have both diarrhea and vomiting. Eventually the vomiting stopped but the stools continued. She was unable to eat and drink and became fatigued which led to her ED visit. In the ED, she was found to have a sodium of 118. It dropped to 113 and PCCM was consulted.   Hospital Course:  Principal Problem:   Acute hyponatremia - due to vomiting/ diarrhea (from laxative and not infectious) in setting of diuretic use - she also states that her PCP has been recommending for the past 2 visits that she drink more water and she has been trying to - sodium improving steadily and is 132 with normal saline infusion - I do not recommend resuming HCTZ  Active Problems:   Primary hypothyroidism - cont Synthroid- Mildly elevated Free T 4 @ 1.95 - TSH is 0.467- will recommend close f/u     Class 1 obesity due to excess calories  - Body mass index is 34.14 kg/m.     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 exam   Discharge Exam: Vitals:   09/26/20 0400 09/26/20 0819  BP: (!) 128/52 105/81  Pulse: 72 89  Resp: 18 (!) 21  Temp: 98.3 F (36.8 C) 98.2 F (36.8 C)  SpO2: 100% 100%   Vitals:   09/25/20 2000 09/26/20 0000 09/26/20 0400 09/26/20 0819  BP: (!) 148/60 (!) 124/56 (!) 128/52 105/81  Pulse: 88 83 72 89  Resp: 19 18 18  (!) 21  Temp: 97.9 F (36.6 C) 98.2 F (36.8 C) 98.3 F (36.8 C) 98.2 F (36.8 C)  TempSrc: Oral Oral Oral Oral  SpO2: 100% 100% 100% 100%  Weight:      Height:        General: Pt is alert, awake, not in acute distress Cardiovascular: RRR, S1/S2 +, no rubs, no gallops Respiratory: CTA bilaterally, no wheezing, no rhonchi Abdominal: Soft, NT, ND, bowel sounds + Extremities: no edema, no cyanosis   Discharge Instructions  Discharge Instructions    Diet - low sodium heart healthy   Complete by: As directed    Increase activity slowly   Complete by: As directed      Allergies as of 09/26/2020      Reactions  Hydrocodone Nausea Only   MAKES PT FEEL VERY "SICK"      Medication List    STOP taking these medications   lisinopril-hydrochlorothiazide 10-12.5 MG tablet Commonly known as: ZESTORETIC     TAKE these medications   aspirin 81 MG tablet Take 81 mg by mouth daily. Po qd (start 01-24-15)   atorvastatin 40 MG tablet Commonly known as: LIPITOR TAKE 1 TABLET DAILY What changed: when to take this   ezetimibe 10 MG tablet Commonly known as: ZETIA TAKE 1 TABLET EVERY DAY What changed: when to take this   lisinopril 10 MG tablet Commonly known as: ZESTRIL Take 1 tablet (10 mg total) by mouth daily.   omega-3 acid ethyl esters 1 g capsule Commonly known as: LOVAZA Take 1 g by  mouth 2 (two) times daily.   ondansetron 4 MG disintegrating tablet Commonly known as: ZOFRAN-ODT Take 1 tablet (4 mg total) by mouth every 8 (eight) hours as needed for nausea or vomiting.   Synthroid 150 MCG tablet Generic drug: levothyroxine Take 1 tablet by mouth Monday - Saturday and 1/2 tablet on sundays What changed:   how much to take  how to take this  when to take this   VITAMIN C PO Take 500 mg by mouth daily.   Vitamin D 125 MCG (5000 UT) Caps Take 1,000 Units by mouth daily.       Follow-up Information    Dorothyann Peng, MD Follow up.   Specialty: Internal Medicine Why: Bmet and Thyroid function tests Contact information: [address] [place] [address] [phone]              Allergies  Allergen Reactions  . Hydrocodone Nausea Only    MAKES PT FEEL VERY "SICK"      DG Abd 2 Views  Result Date: 09/21/2020 CLINICAL DATA:  Nausea, vomiting, diarrhea for 3 days EXAM: ABDOMEN - 2 VIEW COMPARISON:  None. FINDINGS: Supine and upright frontal views of the abdomen and pelvis are obtained. The bowel gas pattern is unremarkable without obstruction or ileus. There are no masses or abnormal calcifications. No free gas in the greater peritoneal sac. Lung bases are clear. IMPRESSION: 1. Unremarkable bowel gas pattern. Electronically Signed   By: Sharlet Salina M.D.   On: 09/21/2020 18:39     The results of significant diagnostics from this hospitalization (including imaging, microbiology, ancillary and laboratory) are listed below for reference.     Microbiology: Recent Results (from the past 240 hour(s))  SARS CORONAVIRUS 2 (TAT 6-24 HRS) Nasopharyngeal Nasopharyngeal Swab     Status: None   Collection Time: 09/21/20  5:41 PM   Specimen: Nasopharyngeal Swab  Result Value Ref Range Status   SARS Coronavirus 2 NEGATIVE NEGATIVE Final    Comment: (NOTE) SARS-CoV-2 target nucleic acids are NOT DETECTED.  The SARS-CoV-2 RNA is  generally detectable in upper and lower respiratory specimens during the acute phase of infection. Negative results do not preclude SARS-CoV-2 infection, do not rule out co-infections with other pathogens, and should not be used as the sole basis for treatment or other patient management decisions. Negative results must be combined with clinical observations, patient history, and epidemiological information. The expected result is Negative.  Fact Sheet for Patients: HairSlick.no  Fact Sheet for Healthcare Providers: quierodirigir.com  This test is not yet approved or cleared by the [place] FDA and  has been authorized for detection and/or diagnosis of SARS-CoV-2 by FDA under an Emergency Use Authorization (EUA).  This EUA will remain  in effect (meaning this test can be used) for the duration of the COVID-19 declaration under Se ction 564(b)(1) of the Act, 21 U.S.C. section 360bbb-3(b)(1), unless the authorization is terminated or revoked sooner.  Performed at [facility], [place] [address], [place], [place] [address]      Labs: BNP (last 3 results) No results for input(s): BNP in the last 8760 hours. Basic Metabolic Panel: Recent Labs  Lab 09/24/20 0241 09/24/20 1603 09/25/20 0344 09/25/20 0944 09/26/20 0356  NA 127* 130* 127* 128* 132*  K 4.0 4.3 4.0 4.2 4.0  CL 94* 95* 98 96* 99  CO2 23 25 22 24 24   GLUCOSE 88 104* 73 110* 93  BUN 5* 6* 6* 5* 5*  CREATININE 0.91 0.99 0.78 0.94 0.90  CALCIUM 9.6 9.8 9.2 9.6 9.6   Liver Function Tests: Recent Labs  Lab 09/21/20 2204  AST 34  ALT 20  ALKPHOS 104  BILITOT 1.2  PROT 6.9  ALBUMIN 3.7   Recent Labs  Lab 09/21/20 2204  LIPASE 27   No results for input(s): AMMONIA in the last 168 hours. CBC: Recent Labs  Lab 09/21/20 1740 09/21/20 2204 09/23/20 0124  WBC 7.2 6.8 6.8  NEUTROABS 4.2  --   --   HGB 13.0 12.9 11.7*  HCT 37.2 37.1 31.3*  MCV  83.6 84.7 81.9  PLT 349 359 322   Cardiac Enzymes: No results for input(s): CKTOTAL, CKMB, CKMBINDEX, TROPONINI in the last 168 hours. BNP: Invalid input(s): POCBNP CBG: Recent Labs  Lab 09/25/20 0611 09/25/20 1140 09/25/20 1632 09/25/20 2038 09/26/20 0606  GLUCAP 83 113* 77 111* 84   D-Dimer No results for input(s): DDIMER in the last 72 hours. Hgb A1c No results for input(s): HGBA1C in the last 72 hours. Lipid Profile No results for input(s): CHOL, HDL, LDLCALC, TRIG, CHOLHDL, LDLDIRECT in the last 72 hours. Thyroid function studies Recent Labs    09/24/20 1603  TSH 0.467   Anemia work up No results for input(s): VITAMINB12, FOLATE, FERRITIN, TIBC, IRON, RETICCTPCT in the last 72 hours. Urinalysis    Component Value Date/Time   COLORURINE YELLOW 12/30/2014 1327   APPEARANCEUR CLEAR 12/30/2014 1327   LABSPEC 1.015 09/21/2020 1806   PHURINE 7.0 09/21/2020 1806   GLUCOSEU NEGATIVE 09/21/2020 1806   HGBUR NEGATIVE 09/21/2020 1806   BILIRUBINUR NEGATIVE 09/21/2020 1806   BILIRUBINUR negative 03/04/2019 1138   KETONESUR NEGATIVE 09/21/2020 1806   PROTEINUR NEGATIVE 09/21/2020 1806   UROBILINOGEN 0.2 09/21/2020 1806   NITRITE NEGATIVE 09/21/2020 1806   LEUKOCYTESUR NEGATIVE 09/21/2020 1806   Sepsis Labs Invalid input(s): PROCALCITONIN,  WBC,  LACTICIDVEN Microbiology Recent Results (from the past 240 hour(s))  SARS CORONAVIRUS 2 (TAT 6-24 HRS) Nasopharyngeal Nasopharyngeal Swab     Status: None   Collection Time: 09/21/20  5:41 PM   Specimen: Nasopharyngeal Swab  Result Value Ref Range Status   SARS Coronavirus 2 NEGATIVE NEGATIVE Final    Comment: (NOTE) SARS-CoV-2 target nucleic acids are NOT DETECTED.  The SARS-CoV-2 RNA is generally detectable in upper and lower respiratory specimens during the acute phase of infection. Negative results do not preclude SARS-CoV-2 infection, do not rule out co-infections with other pathogens, and should not be used as  the sole basis for treatment or other patient management decisions. Negative results must be combined with clinical observations, patient history, and epidemiological information. The expected result is Negative.  Fact Sheet for Patients: SugarRoll.be  Fact Sheet for  Healthcare Providers: https://www.woods-mathews.com/  This test is not yet approved or cleared by the [place] and  has been authorized for detection and/or diagnosis of SARS-CoV-2 by FDA under an Emergency Use Authorization (EUA). This EUA will remain  in effect (meaning this test can be used) for the duration of the COVID-19 declaration under Se ction 564(b)(1) of the Act, 21 U.S.C. section 360bbb-3(b)(1), unless the authorization is terminated or revoked sooner.  Performed at [facility], [place] [address] [place],  [address]      Time coordinating discharge in minutes: 65  SIGNED:   Debbe Odea, MD  [facility] 09/26/2020, 10:23 AM

## 2020-09-26 NOTE — Care Management Important Message (Signed)
Important Message  Patient Details  Name: Hannah Castillo MRN: [id number] Date of Birth: Jun 08, 1941   Medicare Important Message Given:  Yes     Renie Ora 09/26/2020, 9:35 AM

## 2020-09-29 ENCOUNTER — Ambulatory Visit: Payer: Medicare Other

## 2020-09-29 DIAGNOSIS — E039 Hypothyroidism, unspecified: Secondary | ICD-10-CM

## 2020-09-29 DIAGNOSIS — R7309 Other abnormal glucose: Secondary | ICD-10-CM

## 2020-09-29 DIAGNOSIS — E7800 Pure hypercholesterolemia, unspecified: Secondary | ICD-10-CM

## 2020-09-29 DIAGNOSIS — N182 Chronic kidney disease, stage 2 (mild): Secondary | ICD-10-CM

## 2020-09-29 NOTE — Chronic Care Management (AMB) (Signed)
  Chronic Care Management   Note  09/29/2020 Name: Hannah Castillo MRN: [id number] DOB: January 05, 1941   The Care Management team was consulted  to assist the patient with  EMMI RED.   RNCM engaged with [name] today by telephone in response to [facility] RED   referral received on 09/29/20 for RN case management and/or care coordination services. See image below for patient's responses to questions.    Interventions related to negative responses to the following questions: Read discharge papers: patient states she has reviewed the discharge papers and has no questions Know who to call about changes in condition: Reviewed symptoms requiring immediate medical attention vs non urgent symptoms that can be addressed by primary care provider's office. Ensured patient has number to Dr. Allyne Gee  Scheduled follow up: Patient states she made a follow up appointment with Dr Allyne Gee' Np [name] for Monday 10/03/20; ensured patient has transportation to the office  New Prescriptions: Patient states she answered the question on the automated call incorrectly and that she did get the Rx for Lisinopril without HCTZ filled and has been taking it as prescribed.   When asked how she was feeling over she says she feels a little weak and sometimes slightly dizzy with position changes: patient states she lives alone so ensured she has someone with her while she is recovering for her hospitalization of 12/24-12/27 r/t hyponatremia; patient verified her niece is staying with her. Reviewed fall precautions including changing changes slowly, also advised patient to take her blood pressure daily and anytime she feels dizzy and bring home monitor of the BP readings top her office visit on 10/03/20. Patient voiced understanding and compliance.     Follow up plan: Next PCP appointment scheduled for: 10/03/20 Will forward this note to Dr Zella Ball and to Delsa Sale CCM RN for Dr. Zella Ball office.  Cranford Mon RN,  CCM, CDCES (providing coverage for Delsa Sale CCM RN)  [facility] Clinic RN Care Manager [phone]

## 2020-10-03 ENCOUNTER — Ambulatory Visit (INDEPENDENT_AMBULATORY_CARE_PROVIDER_SITE_OTHER): Payer: Medicare Other | Admitting: Nurse Practitioner

## 2020-10-03 ENCOUNTER — Encounter: Payer: Self-pay | Admitting: Nurse Practitioner

## 2020-10-03 ENCOUNTER — Other Ambulatory Visit: Payer: Self-pay

## 2020-10-03 VITALS — BP 116/70 | HR 101 | Temp 98.4°F | Ht 60.0 in | Wt 196.0 lb

## 2020-10-03 DIAGNOSIS — E871 Hypo-osmolality and hyponatremia: Secondary | ICD-10-CM

## 2020-10-03 DIAGNOSIS — I129 Hypertensive chronic kidney disease with stage 1 through stage 4 chronic kidney disease, or unspecified chronic kidney disease: Secondary | ICD-10-CM | POA: Diagnosis not present

## 2020-10-03 DIAGNOSIS — N182 Chronic kidney disease, stage 2 (mild): Secondary | ICD-10-CM | POA: Diagnosis not present

## 2020-10-03 DIAGNOSIS — K5900 Constipation, unspecified: Secondary | ICD-10-CM | POA: Diagnosis not present

## 2020-10-03 MED ORDER — LISINOPRIL 10 MG PO TABS: 10.0000 mg | ORAL_TABLET | Freq: Every day | ORAL | 1 refills | Status: AC

## 2020-10-03 NOTE — Progress Notes (Signed)
I,Yamilka Roman [employer] as a Education administrator for [employer], FNP.,have documented all relevant documentation on the behalf of Minette Brine, FNP,as directed by  Minette Brine, FNP while in the presence of Minette Brine, [place]. This visit occurred during the SARS-CoV-2 public health emergency.  Safety protocols were in place, including screening questions prior to the visit, additional usage of staff PPE, and extensive cleaning of exam room while observing appropriate contact time as indicated for disinfecting solutions.  Subjective:     Patient ID: Hannah Castillo , female    DOB: Nov 10, 1940 , 80 y.o.   MRN: [id number]   Chief Complaint  Patient presents with  . hospital f/u    Patient stated she is feeling a little weak but is slowly getting better    HPI  Here today for hospital follow up from 12/22-12/27.  She went to urgent care her sodium was low at 118 and was admitted to the hospital for replacement. She had been constipated and took a laxative which led her to having diarrhea. She had a low sodium level at that point.   She had been on HCTZ as well and has since been placed on hold.   When she wakes up in the morning since being sick once she starts eating she feels better. She has been drinking more water due to her kidney status. She has not had physical therapy. She lives alone, notices her balance was off other than in the morning she is struggling. She continues to struggle with constipation - she was able to go on her own today.  She does not have any medications to take for her constipation. She is eating an increased amount of fruit. She has already eaten today.  She had a little bit of chicken noodle soup prior to her appt.     Past Medical History:  Diagnosis Date  . Degenerative joint disease   . Diabetes mellitus without complication ([place])    "prediabetic"  . History of bronchitis    "a long time ago"  . Hyperlipidemia    takes Zetia and Atorvastatin daily  .  Hypertension    takes Lisinopril-HCTZ daily  . Hypothyroidism    takes Synthroid daily  . Joint pain   . PONV (postoperative nausea and vomiting)   . Rheumatoid arthritis(714.0)    takes Plaquenil daily     Family History  Problem Relation Age of Onset  . Asthma Mother   . Heart attack Father   . Anesthesia problems Neg Hx      Current Outpatient Medications:  .  Ascorbic Acid (VITAMIN C PO), Take 500 mg by mouth daily., Disp: , Rfl:  .  aspirin 81 MG tablet, Take 81 mg by mouth daily. Po qd (start 01-24-15), Disp: , Rfl:  .  atorvastatin (LIPITOR) 40 MG tablet, TAKE 1 TABLET DAILY (Patient taking differently: Take 40 mg by mouth at bedtime.), Disp: 90 tablet, Rfl: 3 .  Cholecalciferol (VITAMIN D) 125 MCG (5000 UT) CAPS, Take 1,000 Units by mouth daily. , Disp: , Rfl:  .  ezetimibe (ZETIA) 10 MG tablet, TAKE 1 TABLET EVERY DAY (Patient taking differently: Take 10 mg by mouth at bedtime.), Disp: 90 tablet, Rfl: 1 .  omega-3 acid ethyl esters (LOVAZA) 1 g capsule, Take 1 g by mouth 2 (two) times daily., Disp: , Rfl:  .  SYNTHROID 150 MCG tablet, Take 1 tablet by mouth Monday - Saturday and 1/2 tablet on sundays (Patient taking differently: Take 150 mcg by  mouth See admin instructions. Take 1 tablet by mouth Monday - Saturday and 1/2 tablet on sundays), Disp: 90 tablet, Rfl: 3 .  lisinopril (ZESTRIL) 10 MG tablet, Take 1 tablet (10 mg total) by mouth daily., Disp: 90 tablet, Rfl: 1   Allergies  Allergen Reactions  . Hydrocodone Nausea Only    MAKES PT FEEL VERY "SICK"     Review of Systems  Constitutional: Negative.   Respiratory: Negative.   Cardiovascular: Negative for chest pain, palpitations and leg swelling.  Gastrointestinal: Positive for constipation.  Psychiatric/Behavioral: Negative.      Today's Vitals   10/03/20 1418  BP: 116/70  Pulse: (!) 101  Temp: 98.4 F (36.9 C)  TempSrc: Oral  Weight: 196 lb (88.9 kg)  Height: 5' (1.524 m)  PainSc: 0-No pain   Body  mass index is 38.28 kg/m.   Objective:  Physical Exam Vitals reviewed.  Constitutional:      Appearance: She is well-developed.  HENT:     Head: Normocephalic and atraumatic.  Eyes:     Pupils: Pupils are equal, round, and reactive to light.  Cardiovascular:     Rate and Rhythm: Normal rate and regular rhythm.     Pulses: Normal pulses.     Heart sounds: Normal heart sounds. No murmur heard.   Pulmonary:     Effort: Pulmonary effort is normal. No respiratory distress.     Breath sounds: Normal breath sounds.  Skin:    General: Skin is warm and dry.     Capillary Refill: Capillary refill takes less than 2 seconds.  Neurological:     General: No focal deficit present.     Mental Status: She is alert and oriented to person, place, and time.     Cranial Nerves: No cranial nerve deficit.  Psychiatric:        Mood and Affect: Mood normal.        Behavior: Behavior normal.        Thought Content: Thought content normal.        Judgment: Judgment normal.         Assessment And Plan:     1. Hypertensive nephropathy TCM Performed. A member of the clinical team spoke with the patient upon dischare. Discharge summary was reviewed in full detail during the visit. Meds reconciled and compared to discharge meds. Medication list is updated and reviewed with the patient.  Greater than 50% face to face time was spent in counseling an coordination of care.  All questions were answered to the satisfaction of the patient.   She is doing better since being discharged from the hospital - lisinopril (ZESTRIL) 10 MG tablet; Take 1 tablet (10 mg total) by mouth daily.  Dispense: 90 tablet; Refill: 1 - BMP8+eGFR  2. Hyponatremia  She had a low sodium will recheck levels today  She is advised to continue to remain off of her HCTZ  - BMP8+eGFR  3. Constipation, unspecified constipation type  She is advised to avoid laxatives as this may have been the cause of her low sodium due to having  diarrhea   Patient was given opportunity to ask questions. Patient verbalized understanding of the plan and was able to repeat key elements of the plan. All questions were answered to their satisfaction.  Minette Brine, FNP   I, Minette Brine, FNP, have reviewed all documentation for this visit. The documentation on 11/15/20 for the exam, diagnosis, procedures, and orders are all accurate and complete.   THE PATIENT  IS ENCOURAGED TO PRACTICE SOCIAL DISTANCING DUE TO THE COVID-19 PANDEMIC.

## 2020-10-04 LAB — BMP8+EGFR
BUN/Creatinine Ratio: 15 (ref 12–28)
BUN: 15 mg/dL (ref 8–27)
CO2: 24 mmol/L (ref 20–29)
Calcium: 10.1 mg/dL (ref 8.7–10.3)
Chloride: 99 mmol/L (ref 96–106)
Creatinine, Ser: 1 mg/dL (ref 0.57–1.00)
GFR calc Af Amer: 61 mL/min/{1.73_m2} (ref >59)
GFR calc non Af Amer: 53 mL/min/{1.73_m2} — ABNORMAL LOW (ref >59)
Glucose: 93 mg/dL (ref 65–99)
Potassium: 4.2 mmol/L (ref 3.5–5.2)
Sodium: 134 mmol/L (ref 134–144)

## 2020-10-12 DIAGNOSIS — E119 Type 2 diabetes mellitus without complications: Secondary | ICD-10-CM | POA: Diagnosis not present

## 2020-10-12 DIAGNOSIS — I129 Hypertensive chronic kidney disease with stage 1 through stage 4 chronic kidney disease, or unspecified chronic kidney disease: Secondary | ICD-10-CM | POA: Diagnosis not present

## 2020-10-12 DIAGNOSIS — R7989 Other specified abnormal findings of blood chemistry: Secondary | ICD-10-CM | POA: Diagnosis not present

## 2020-10-12 DIAGNOSIS — E871 Hypo-osmolality and hyponatremia: Secondary | ICD-10-CM | POA: Diagnosis not present

## 2020-10-13 NOTE — Progress Notes (Signed)
No she does not need to have a CT scan and I have not placed an order she is okay.

## 2020-10-20 ENCOUNTER — Other Ambulatory Visit: Payer: Medicare Other

## 2020-10-20 ENCOUNTER — Ambulatory Visit: Payer: Medicare Other | Admitting: Nurse Practitioner

## 2020-11-22 ENCOUNTER — Other Ambulatory Visit: Payer: Self-pay | Admitting: Internal Medicine

## 2020-12-23 ENCOUNTER — Other Ambulatory Visit: Payer: Self-pay | Admitting: Internal Medicine

## 2020-12-23 DIAGNOSIS — Z1231 Encounter for screening mammogram for malignant neoplasm of breast: Secondary | ICD-10-CM

## 2021-02-02 DIAGNOSIS — E782 Mixed hyperlipidemia: Secondary | ICD-10-CM | POA: Diagnosis not present

## 2021-02-02 DIAGNOSIS — E669 Obesity, unspecified: Secondary | ICD-10-CM | POA: Diagnosis not present

## 2021-02-02 DIAGNOSIS — E039 Hypothyroidism, unspecified: Secondary | ICD-10-CM | POA: Diagnosis not present

## 2021-02-02 DIAGNOSIS — M7989 Other specified soft tissue disorders: Secondary | ICD-10-CM | POA: Diagnosis not present

## 2021-02-02 DIAGNOSIS — I10 Essential (primary) hypertension: Secondary | ICD-10-CM | POA: Diagnosis not present

## 2021-02-15 ENCOUNTER — Other Ambulatory Visit: Payer: Self-pay

## 2021-02-15 ENCOUNTER — Ambulatory Visit
Admission: RE | Admit: 2021-02-15 | Discharge: 2021-02-15 | Disposition: A | Discharge status: Home / Self Care | Payer: Medicare Other | Source: Ambulatory Visit | Attending: Internal Medicine | Admitting: Internal Medicine

## 2021-02-15 DIAGNOSIS — Z1231 Encounter for screening mammogram for malignant neoplasm of breast: Secondary | ICD-10-CM

## 2021-03-16 ENCOUNTER — Ambulatory Visit: Payer: Medicare Other | Admitting: Internal Medicine

## 2021-03-16 ENCOUNTER — Ambulatory Visit: Payer: Medicare Other

## 2021-03-28 ENCOUNTER — Telehealth: Payer: Self-pay | Admitting: Internal Medicine

## 2021-03-28 NOTE — Telephone Encounter (Signed)
Correction scheduled with [facility] not [facility]

## 2021-03-28 NOTE — Telephone Encounter (Signed)
Pt has a different PCP in epic please confirm who patient PCP  Left message for patient to call back and schedule Medicare Annual Wellness Visit (AWV) either virtually or in office.   Last AWV 03/09/20  please schedule at anytime with [facility] Nurse Health Advisor 1 or 2   This should be a 45 minute visit.

## 2021-08-08 DIAGNOSIS — I10 Essential (primary) hypertension: Secondary | ICD-10-CM | POA: Diagnosis not present

## 2021-08-08 DIAGNOSIS — E669 Obesity, unspecified: Secondary | ICD-10-CM | POA: Diagnosis not present

## 2021-08-08 DIAGNOSIS — M7989 Other specified soft tissue disorders: Secondary | ICD-10-CM | POA: Diagnosis not present

## 2021-08-08 DIAGNOSIS — E782 Mixed hyperlipidemia: Secondary | ICD-10-CM | POA: Diagnosis not present

## 2021-08-08 DIAGNOSIS — Z6839 Body mass index (BMI) 39.0-39.9, adult: Secondary | ICD-10-CM | POA: Diagnosis not present

## 2021-08-08 DIAGNOSIS — E039 Hypothyroidism, unspecified: Secondary | ICD-10-CM | POA: Diagnosis not present

## 2021-10-24 DIAGNOSIS — I129 Hypertensive chronic kidney disease with stage 1 through stage 4 chronic kidney disease, or unspecified chronic kidney disease: Secondary | ICD-10-CM | POA: Diagnosis not present

## 2021-10-24 DIAGNOSIS — R7989 Other specified abnormal findings of blood chemistry: Secondary | ICD-10-CM | POA: Diagnosis not present

## 2021-10-24 DIAGNOSIS — E119 Type 2 diabetes mellitus without complications: Secondary | ICD-10-CM | POA: Diagnosis not present

## 2021-10-24 DIAGNOSIS — E871 Hypo-osmolality and hyponatremia: Secondary | ICD-10-CM | POA: Diagnosis not present

## 2021-11-07 DIAGNOSIS — I10 Essential (primary) hypertension: Secondary | ICD-10-CM | POA: Diagnosis not present

## 2021-12-19 DIAGNOSIS — I10 Essential (primary) hypertension: Secondary | ICD-10-CM | POA: Diagnosis not present

## 2022-02-02 ENCOUNTER — Other Ambulatory Visit: Payer: Self-pay | Admitting: Family Medicine

## 2022-02-02 ENCOUNTER — Other Ambulatory Visit: Payer: Self-pay | Admitting: Internal Medicine

## 2022-02-02 DIAGNOSIS — Z1231 Encounter for screening mammogram for malignant neoplasm of breast: Secondary | ICD-10-CM

## 2022-02-05 DIAGNOSIS — Z13820 Encounter for screening for osteoporosis: Secondary | ICD-10-CM | POA: Diagnosis not present

## 2022-02-05 DIAGNOSIS — Z6836 Body mass index (BMI) 36.0-36.9, adult: Secondary | ICD-10-CM | POA: Diagnosis not present

## 2022-02-05 DIAGNOSIS — Z0000 Encounter for general adult medical examination without abnormal findings: Secondary | ICD-10-CM | POA: Diagnosis not present

## 2022-02-05 DIAGNOSIS — E669 Obesity, unspecified: Secondary | ICD-10-CM | POA: Diagnosis not present

## 2022-02-05 DIAGNOSIS — E039 Hypothyroidism, unspecified: Secondary | ICD-10-CM | POA: Diagnosis not present

## 2022-02-05 DIAGNOSIS — K5909 Other constipation: Secondary | ICD-10-CM | POA: Diagnosis not present

## 2022-02-05 DIAGNOSIS — Z23 Encounter for immunization: Secondary | ICD-10-CM | POA: Diagnosis not present

## 2022-02-05 DIAGNOSIS — E782 Mixed hyperlipidemia: Secondary | ICD-10-CM | POA: Diagnosis not present

## 2022-02-05 DIAGNOSIS — I10 Essential (primary) hypertension: Secondary | ICD-10-CM | POA: Diagnosis not present

## 2022-02-05 DIAGNOSIS — M1990 Unspecified osteoarthritis, unspecified site: Secondary | ICD-10-CM | POA: Diagnosis not present

## 2022-02-05 DIAGNOSIS — N1831 Chronic kidney disease, stage 3a: Secondary | ICD-10-CM | POA: Diagnosis not present

## 2022-02-12 ENCOUNTER — Other Ambulatory Visit: Payer: Self-pay | Admitting: Internal Medicine

## 2022-02-12 DIAGNOSIS — Z13820 Encounter for screening for osteoporosis: Secondary | ICD-10-CM

## 2022-02-20 ENCOUNTER — Ambulatory Visit
Admission: RE | Admit: 2022-02-20 | Discharge: 2022-02-20 | Disposition: A | Discharge status: Home / Self Care | Payer: Medicare Other | Source: Ambulatory Visit | Attending: Internal Medicine | Admitting: Internal Medicine

## 2022-02-20 DIAGNOSIS — Z1231 Encounter for screening mammogram for malignant neoplasm of breast: Secondary | ICD-10-CM | POA: Diagnosis not present

## 2022-08-07 DIAGNOSIS — E782 Mixed hyperlipidemia: Secondary | ICD-10-CM | POA: Diagnosis not present

## 2022-08-07 DIAGNOSIS — R0609 Other forms of dyspnea: Secondary | ICD-10-CM | POA: Diagnosis not present

## 2022-08-07 DIAGNOSIS — E669 Obesity, unspecified: Secondary | ICD-10-CM | POA: Diagnosis not present

## 2022-08-07 DIAGNOSIS — N1831 Chronic kidney disease, stage 3a: Secondary | ICD-10-CM | POA: Diagnosis not present

## 2022-08-07 DIAGNOSIS — I10 Essential (primary) hypertension: Secondary | ICD-10-CM | POA: Diagnosis not present

## 2022-08-07 DIAGNOSIS — R600 Localized edema: Secondary | ICD-10-CM | POA: Diagnosis not present

## 2022-08-07 DIAGNOSIS — E039 Hypothyroidism, unspecified: Secondary | ICD-10-CM | POA: Diagnosis not present

## 2022-08-10 ENCOUNTER — Ambulatory Visit
Admission: RE | Admit: 2022-08-10 | Discharge: 2022-08-10 | Disposition: A | Discharge status: Home / Self Care | Payer: Medicare Other | Source: Ambulatory Visit | Attending: Internal Medicine | Admitting: Internal Medicine

## 2022-08-10 DIAGNOSIS — M8589 Other specified disorders of bone density and structure, multiple sites: Secondary | ICD-10-CM | POA: Diagnosis not present

## 2022-08-10 DIAGNOSIS — Z13820 Encounter for screening for osteoporosis: Secondary | ICD-10-CM

## 2022-08-10 DIAGNOSIS — Z780 Asymptomatic menopausal state: Secondary | ICD-10-CM | POA: Diagnosis not present

## 2022-10-22 DIAGNOSIS — E1122 Type 2 diabetes mellitus with diabetic chronic kidney disease: Secondary | ICD-10-CM | POA: Diagnosis not present

## 2022-10-22 DIAGNOSIS — N1831 Chronic kidney disease, stage 3a: Secondary | ICD-10-CM | POA: Diagnosis not present

## 2022-10-22 DIAGNOSIS — E119 Type 2 diabetes mellitus without complications: Secondary | ICD-10-CM | POA: Diagnosis not present

## 2022-10-22 DIAGNOSIS — I129 Hypertensive chronic kidney disease with stage 1 through stage 4 chronic kidney disease, or unspecified chronic kidney disease: Secondary | ICD-10-CM | POA: Diagnosis not present

## 2022-10-22 DIAGNOSIS — E871 Hypo-osmolality and hyponatremia: Secondary | ICD-10-CM | POA: Diagnosis not present

## 2023-01-21 ENCOUNTER — Other Ambulatory Visit: Payer: Self-pay | Admitting: Internal Medicine

## 2023-01-21 DIAGNOSIS — Z1231 Encounter for screening mammogram for malignant neoplasm of breast: Secondary | ICD-10-CM

## 2023-01-21 DIAGNOSIS — Z9889 Other specified postprocedural states: Secondary | ICD-10-CM

## 2023-02-07 DIAGNOSIS — N1831 Chronic kidney disease, stage 3a: Secondary | ICD-10-CM | POA: Diagnosis not present

## 2023-02-07 DIAGNOSIS — Z0000 Encounter for general adult medical examination without abnormal findings: Secondary | ICD-10-CM | POA: Diagnosis not present

## 2023-02-07 DIAGNOSIS — K5909 Other constipation: Secondary | ICD-10-CM | POA: Diagnosis not present

## 2023-02-07 DIAGNOSIS — I10 Essential (primary) hypertension: Secondary | ICD-10-CM | POA: Diagnosis not present

## 2023-02-07 DIAGNOSIS — E039 Hypothyroidism, unspecified: Secondary | ICD-10-CM | POA: Diagnosis not present

## 2023-02-07 DIAGNOSIS — Z23 Encounter for immunization: Secondary | ICD-10-CM | POA: Diagnosis not present

## 2023-02-07 DIAGNOSIS — M8588 Other specified disorders of bone density and structure, other site: Secondary | ICD-10-CM | POA: Diagnosis not present

## 2023-02-07 DIAGNOSIS — E782 Mixed hyperlipidemia: Secondary | ICD-10-CM | POA: Diagnosis not present

## 2023-02-07 DIAGNOSIS — M1990 Unspecified osteoarthritis, unspecified site: Secondary | ICD-10-CM | POA: Diagnosis not present

## 2023-03-01 ENCOUNTER — Ambulatory Visit
Admission: RE | Admit: 2023-03-01 | Discharge: 2023-03-01 | Disposition: A | Discharge status: Home / Self Care | Payer: Medicare Other | Source: Ambulatory Visit | Attending: Internal Medicine | Admitting: Internal Medicine

## 2023-03-01 ENCOUNTER — Ambulatory Visit: Payer: Medicare Other

## 2023-03-01 ENCOUNTER — Other Ambulatory Visit: Payer: Self-pay | Admitting: Internal Medicine

## 2023-03-01 DIAGNOSIS — Z1231 Encounter for screening mammogram for malignant neoplasm of breast: Secondary | ICD-10-CM

## 2023-06-11 DIAGNOSIS — M79641 Pain in right hand: Secondary | ICD-10-CM | POA: Diagnosis not present

## 2023-06-11 DIAGNOSIS — M19041 Primary osteoarthritis, right hand: Secondary | ICD-10-CM | POA: Diagnosis not present

## 2023-07-01 DIAGNOSIS — M1811 Unilateral primary osteoarthritis of first carpometacarpal joint, right hand: Secondary | ICD-10-CM | POA: Diagnosis not present

## 2023-08-13 DIAGNOSIS — Z6836 Body mass index (BMI) 36.0-36.9, adult: Secondary | ICD-10-CM | POA: Diagnosis not present

## 2023-08-13 DIAGNOSIS — N1831 Chronic kidney disease, stage 3a: Secondary | ICD-10-CM | POA: Diagnosis not present

## 2023-08-13 DIAGNOSIS — M19041 Primary osteoarthritis, right hand: Secondary | ICD-10-CM | POA: Diagnosis not present

## 2023-08-13 DIAGNOSIS — E039 Hypothyroidism, unspecified: Secondary | ICD-10-CM | POA: Diagnosis not present

## 2023-08-13 DIAGNOSIS — I10 Essential (primary) hypertension: Secondary | ICD-10-CM | POA: Diagnosis not present

## 2023-08-13 DIAGNOSIS — E782 Mixed hyperlipidemia: Secondary | ICD-10-CM | POA: Diagnosis not present

## 2023-10-08 DIAGNOSIS — E039 Hypothyroidism, unspecified: Secondary | ICD-10-CM | POA: Diagnosis not present

## 2023-10-24 DIAGNOSIS — E1122 Type 2 diabetes mellitus with diabetic chronic kidney disease: Secondary | ICD-10-CM | POA: Diagnosis not present

## 2023-10-24 DIAGNOSIS — N1831 Chronic kidney disease, stage 3a: Secondary | ICD-10-CM | POA: Diagnosis not present

## 2023-10-24 DIAGNOSIS — E119 Type 2 diabetes mellitus without complications: Secondary | ICD-10-CM | POA: Diagnosis not present

## 2023-10-24 DIAGNOSIS — E871 Hypo-osmolality and hyponatremia: Secondary | ICD-10-CM | POA: Diagnosis not present

## 2023-10-24 DIAGNOSIS — I129 Hypertensive chronic kidney disease with stage 1 through stage 4 chronic kidney disease, or unspecified chronic kidney disease: Secondary | ICD-10-CM | POA: Diagnosis not present

## 2024-01-28 ENCOUNTER — Other Ambulatory Visit: Payer: Self-pay | Admitting: Internal Medicine

## 2024-01-28 DIAGNOSIS — Z1231 Encounter for screening mammogram for malignant neoplasm of breast: Secondary | ICD-10-CM

## 2024-02-11 DIAGNOSIS — I10 Essential (primary) hypertension: Secondary | ICD-10-CM | POA: Diagnosis not present

## 2024-02-11 DIAGNOSIS — N1831 Chronic kidney disease, stage 3a: Secondary | ICD-10-CM | POA: Diagnosis not present

## 2024-02-11 DIAGNOSIS — E782 Mixed hyperlipidemia: Secondary | ICD-10-CM | POA: Diagnosis not present

## 2024-02-11 DIAGNOSIS — Z0000 Encounter for general adult medical examination without abnormal findings: Secondary | ICD-10-CM | POA: Diagnosis not present

## 2024-02-11 DIAGNOSIS — E039 Hypothyroidism, unspecified: Secondary | ICD-10-CM | POA: Diagnosis not present

## 2024-02-11 DIAGNOSIS — M19041 Primary osteoarthritis, right hand: Secondary | ICD-10-CM | POA: Diagnosis not present

## 2024-02-11 DIAGNOSIS — K5909 Other constipation: Secondary | ICD-10-CM | POA: Diagnosis not present

## 2024-02-11 DIAGNOSIS — Z1159 Encounter for screening for other viral diseases: Secondary | ICD-10-CM | POA: Diagnosis not present

## 2024-02-11 DIAGNOSIS — R600 Localized edema: Secondary | ICD-10-CM | POA: Diagnosis not present

## 2024-02-11 DIAGNOSIS — M8588 Other specified disorders of bone density and structure, other site: Secondary | ICD-10-CM | POA: Diagnosis not present

## 2024-02-11 DIAGNOSIS — M1990 Unspecified osteoarthritis, unspecified site: Secondary | ICD-10-CM | POA: Diagnosis not present

## 2024-03-02 ENCOUNTER — Ambulatory Visit
Admission: RE | Admit: 2024-03-02 | Discharge: 2024-03-02 | Disposition: A | Discharge status: Home / Self Care | Source: Ambulatory Visit | Attending: Internal Medicine | Admitting: Internal Medicine

## 2024-03-02 DIAGNOSIS — Z1231 Encounter for screening mammogram for malignant neoplasm of breast: Secondary | ICD-10-CM

## 2024-08-18 DIAGNOSIS — E782 Mixed hyperlipidemia: Secondary | ICD-10-CM | POA: Diagnosis not present

## 2024-08-18 DIAGNOSIS — N1831 Chronic kidney disease, stage 3a: Secondary | ICD-10-CM | POA: Diagnosis not present

## 2024-08-18 DIAGNOSIS — K5909 Other constipation: Secondary | ICD-10-CM | POA: Diagnosis not present

## 2024-08-18 DIAGNOSIS — R600 Localized edema: Secondary | ICD-10-CM | POA: Diagnosis not present

## 2024-08-18 DIAGNOSIS — M8588 Other specified disorders of bone density and structure, other site: Secondary | ICD-10-CM | POA: Diagnosis not present

## 2024-08-18 DIAGNOSIS — E039 Hypothyroidism, unspecified: Secondary | ICD-10-CM | POA: Diagnosis not present

## 2024-08-18 DIAGNOSIS — I10 Essential (primary) hypertension: Secondary | ICD-10-CM | POA: Diagnosis not present

## 2024-08-18 DIAGNOSIS — M25511 Pain in right shoulder: Secondary | ICD-10-CM | POA: Diagnosis not present
